# Patient Record
Sex: Female | Born: 1990 | Race: White | Hispanic: No | Marital: Single | Smoking: Current every day smoker
Health system: Southern US, Community
[De-identification: ages and names within clinical notes are randomized; demographics above are authoritative.]

## PROBLEM LIST (undated history)

## (undated) ENCOUNTER — Inpatient Hospital Stay (HOSPITAL_COMMUNITY): Payer: Self-pay

## (undated) DIAGNOSIS — D649 Anemia, unspecified: Secondary | ICD-10-CM

## (undated) DIAGNOSIS — F99 Mental disorder, not otherwise specified: Secondary | ICD-10-CM

## (undated) DIAGNOSIS — F419 Anxiety disorder, unspecified: Secondary | ICD-10-CM

---
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
## 2011-04-29 ENCOUNTER — Inpatient Hospital Stay: Payer: Self-pay | Admitting: Psychiatry

## 2012-04-26 HISTORY — PX: THERAPEUTIC ABORTION: SHX798

## 2012-06-20 ENCOUNTER — Emergency Department: Payer: Self-pay | Admitting: Emergency Medicine

## 2012-06-20 LAB — CBC
HCT: 39 % (ref 35.0–47.0)
HGB: 13.4 g/dL (ref 12.0–16.0)
MCHC: 34.3 g/dL (ref 32.0–36.0)
WBC: 7.2 10*3/uL (ref 3.6–11.0)

## 2012-06-20 LAB — URINALYSIS, COMPLETE
Bacteria: NONE SEEN
Bilirubin,UR: NEGATIVE
Ketone: NEGATIVE
Nitrite: NEGATIVE
RBC,UR: 1 /HPF (ref 0–5)
Squamous Epithelial: 3
WBC UR: 8 /HPF (ref 0–5)

## 2012-06-20 LAB — COMPREHENSIVE METABOLIC PANEL
Alkaline Phosphatase: 47 U/L — ABNORMAL LOW (ref 82–169)
Calcium, Total: 9.4 mg/dL (ref 9.0–10.7)
Chloride: 107 mmol/L (ref 98–107)
Co2: 27 mmol/L (ref 21–32)
EGFR (African American): >60
EGFR (Non-African Amer.): >60
SGPT (ALT): 11 U/L — ABNORMAL LOW

## 2012-06-20 LAB — HCG, QUANTITATIVE, PREGNANCY: Beta Hcg, Quant.: 1398 m[IU]/mL — ABNORMAL HIGH

## 2012-07-27 ENCOUNTER — Emergency Department: Payer: Self-pay | Admitting: Internal Medicine

## 2012-07-27 ENCOUNTER — Emergency Department: Payer: Self-pay | Admitting: Unknown Physician Specialty

## 2012-07-31 ENCOUNTER — Emergency Department: Payer: Self-pay | Admitting: Emergency Medicine

## 2012-11-02 ENCOUNTER — Emergency Department: Payer: Self-pay | Admitting: Emergency Medicine

## 2013-02-12 ENCOUNTER — Emergency Department: Payer: Self-pay | Admitting: Emergency Medicine

## 2013-02-12 LAB — COMPREHENSIVE METABOLIC PANEL
Albumin: 4.3 g/dL (ref 3.4–5.0)
Alkaline Phosphatase: 59 U/L (ref 50–136)
Calcium, Total: 8.4 mg/dL — ABNORMAL LOW (ref 8.5–10.1)
Chloride: 107 mmol/L (ref 98–107)
Co2: 26 mmol/L (ref 21–32)
Glucose: 93 mg/dL (ref 65–99)
SGOT(AST): 25 U/L (ref 15–37)
Sodium: 139 mmol/L (ref 136–145)

## 2013-02-12 LAB — URINALYSIS, COMPLETE
Squamous Epithelial: NONE SEEN
WBC UR: NONE SEEN /HPF (ref 0–5)

## 2013-02-12 LAB — CBC
HGB: 12.1 g/dL (ref 12.0–16.0)
MCH: 32.6 pg (ref 26.0–34.0)
RDW: 12.6 % (ref 11.5–14.5)
WBC: 8 10*3/uL (ref 3.6–11.0)

## 2013-02-12 LAB — PREGNANCY, URINE: Pregnancy Test, Urine: POSITIVE m[IU]/mL

## 2013-02-14 ENCOUNTER — Emergency Department: Payer: Self-pay | Admitting: Unknown Physician Specialty

## 2013-02-14 LAB — COMPREHENSIVE METABOLIC PANEL
Alkaline Phosphatase: 49 U/L — ABNORMAL LOW (ref 50–136)
BUN: 10 mg/dL (ref 7–18)
Bilirubin,Total: 0.3 mg/dL (ref 0.2–1.0)
Calcium, Total: 8.6 mg/dL (ref 8.5–10.1)
Chloride: 110 mmol/L — ABNORMAL HIGH (ref 98–107)
EGFR (African American): >60
EGFR (Non-African Amer.): >60
Glucose: 95 mg/dL (ref 65–99)
SGOT(AST): 27 U/L (ref 15–37)
Total Protein: 7.1 g/dL (ref 6.4–8.2)

## 2013-02-14 LAB — CBC
MCH: 32.5 pg (ref 26.0–34.0)
MCHC: 34.8 g/dL (ref 32.0–36.0)
Platelet: 207 10*3/uL (ref 150–440)
RBC: 3.47 10*6/uL — ABNORMAL LOW (ref 3.80–5.20)
RDW: 12.3 % (ref 11.5–14.5)

## 2013-02-16 ENCOUNTER — Inpatient Hospital Stay (HOSPITAL_COMMUNITY)
Admission: AD | Admit: 2013-02-16 | Discharge: 2013-02-17 | Disposition: A | Discharge status: Home / Self Care | Payer: Self-pay | Source: Ambulatory Visit | Attending: Obstetrics & Gynecology | Admitting: Obstetrics & Gynecology

## 2013-02-16 ENCOUNTER — Inpatient Hospital Stay (HOSPITAL_COMMUNITY): Payer: Self-pay

## 2013-02-16 ENCOUNTER — Encounter (HOSPITAL_COMMUNITY): Payer: Self-pay | Admitting: *Deleted

## 2013-02-16 DIAGNOSIS — O039 Complete or unspecified spontaneous abortion without complication: Secondary | ICD-10-CM

## 2013-02-16 DIAGNOSIS — O200 Threatened abortion: Secondary | ICD-10-CM | POA: Insufficient documentation

## 2013-02-16 HISTORY — DX: Anxiety disorder, unspecified: F41.9

## 2013-02-16 HISTORY — DX: Mental disorder, not otherwise specified: F99

## 2013-02-16 HISTORY — DX: Anemia, unspecified: D64.9

## 2013-02-16 LAB — CBC
HCT: 33.6 % — ABNORMAL LOW (ref 36.0–46.0)
MCV: 91.6 fL (ref 78.0–100.0)
Platelets: 193 10*3/uL (ref 150–400)
RBC: 3.67 MIL/uL — ABNORMAL LOW (ref 3.87–5.11)
RDW: 12.1 % (ref 11.5–15.5)
WBC: 6.1 10*3/uL (ref 4.0–10.5)

## 2013-02-16 LAB — URINALYSIS, ROUTINE W REFLEX MICROSCOPIC
Glucose, UA: NEGATIVE mg/dL
Protein, ur: NEGATIVE mg/dL
Specific Gravity, Urine: 1.01 (ref 1.005–1.030)

## 2013-02-16 LAB — HCG, QUANTITATIVE, PREGNANCY: hCG, Beta Chain, Quant, S: 100 m[IU]/mL — ABNORMAL HIGH (ref <5)

## 2013-02-16 LAB — ABO/RH: ABO/RH(D): A POS

## 2013-02-16 LAB — URINE MICROSCOPIC-ADD ON

## 2013-02-16 MED ORDER — OXYCODONE-ACETAMINOPHEN 5-325 MG PO TABS: 2.0000 | ORAL_TABLET | ORAL | Status: DC | PRN

## 2013-02-16 MED ORDER — CLONAZEPAM 0.5 MG PO TABS
0.5000 mg | ORAL_TABLET | Freq: Once | ORAL | Status: AC
  Administered 2013-02-16: 0.5 mg via ORAL
  Filled 2013-02-16: qty 1

## 2013-02-16 MED ORDER — MISOPROSTOL 200 MCG PO TABS: ORAL_TABLET | ORAL | Status: DC

## 2013-02-16 MED ORDER — KETOROLAC TROMETHAMINE 60 MG/2ML IM SOLN
60.0000 mg | Freq: Once | INTRAMUSCULAR | Status: AC
  Administered 2013-02-16: 60 mg via INTRAMUSCULAR
  Filled 2013-02-16: qty 2

## 2013-02-16 MED ORDER — PROMETHAZINE HCL 25 MG PO TABS: 25.0000 mg | ORAL_TABLET | Freq: Four times a day (QID) | ORAL | Status: DC | PRN

## 2013-02-16 NOTE — Discharge Instructions (Signed)
Miscarriage A miscarriage is the loss of an unborn baby (fetus) before the 20th week of pregnancy. The cause is often unknown.  HOME CARE  You may need to stay in bed (bed rest), or you may be able to do light activity. Go about activity as told by your doctor.  Have help at home.  Write down how many pads you use each day. Write down how soaked they are.  Do not use tampons. Do not wash out your vagina (douche) or have sex (intercourse) until your doctor approves.  Only take medicine as told by your doctor.  Do not take aspirin.  Keep all doctor visits as told.  If you or your partner have problems with grieving, talk to your doctor. You can also try counseling. Give yourself time to grieve before trying to get pregnant again. GET HELP RIGHT AWAY IF:  You have bad cramps or pain in your back or belly (abdomen).  You have a fever.  You pass large clumps of blood (clots) from your vagina that are walnut-sized or larger. Save the clumps for your doctor to see.  You pass large amounts of tissue from your vagina. Save the tissue for your doctor to see.  You have more bleeding.  You have thick, bad-smelling fluid (discharge) coming from the vagina.  You get lightheaded, weak, or you pass out (faint).  You have chills. MAKE SURE YOU:  Understand these instructions.  Will watch your condition.  Will get help right away if you are not doing well or get worse. Document Released: 02/04/2012 Document Reviewed: 02/04/2012 [facility] Patient Information 2013 [place], [place].  Make an appointment to follow-up with your GYN in about 2 weeks Go to an ER if you develop severe pain, heavy bleeding or fever

## 2013-02-16 NOTE — MAU Provider Note (Signed)
History     CSN: [id number]  Arrival date and time: 02/16/13 1744   None     Chief Complaint  Patient presents with  . Vaginal Bleeding  . Abdominal Pain   HPI 22 y.o. G2P0010 at [redacted]w[redacted]d by LMP with bleeding and pain x 2 weeks. Seen at [facility] for this complaint on 3/21 and 3/23. Had pelvic exam with check for infections, quant and u/s on the first visit and repeat quant on the second visit. States she can't remember what the ultrasound showed, but the quant decreased from over 1000 the first day to 250 yesterday. Bleeding has slowed today, but pain continues in low abd, low back, hips and legs. Has not taken anything for pain since this began 2 weeks ago. She states she sees [place], ANP at [facility] in [place] and she told her to come here right away today because she needed a D&C.    Past Medical History  Diagnosis Date  . Anxiety   . PTSD   . Anemia     Past Surgical History  Procedure Laterality Date  . Therapeutic abortion  june 2013  . Wisdom tooth extraction  2012    History reviewed. No pertinent family history.  History  Substance Use Topics  . Smoking status: Current Every Day Smoker -- 0.50 packs/day    Types: Cigarettes  . Smokeless tobacco: Not on file  . Alcohol Use: No    Allergies: No Known Allergies  Prescriptions prior to admission  Medication Sig Dispense Refill  . bacitracin ointment Apply topically 2 (two) times daily. Tooth pain      . Cholecalciferol (D-3-5 PO) Take by mouth.      . clindamycin (CLEOCIN) 150 MG capsule Take 150 mg by mouth 3 (three) times daily.      . clonazePAM (KLONOPIN) 0.5 MG tablet Take 0.5 mg by mouth 2 (two) times daily as needed for anxiety.      . ferrous sulfate 325 (65 FE) MG tablet Take 325 mg by mouth daily with breakfast.        Review of Systems  Constitutional: Negative.  Negative for fever and chills.  Respiratory: Negative.   Cardiovascular: Negative.   Gastrointestinal:  Positive for abdominal pain. Negative for nausea, vomiting, diarrhea and constipation.  Genitourinary: Negative for dysuria, urgency, frequency, hematuria and flank pain.       Positive bleeding   Musculoskeletal: Positive for back pain.  Neurological: Positive for headaches.  Psychiatric/Behavioral: The patient is nervous/anxious.    Physical Exam   Blood pressure 111/55, pulse 97, temperature 98.3 F (36.8 C), temperature source Oral, resp. rate 16, height 5\' 2"  (1.575 m), weight 114 lb 12.8 oz (52.073 kg), last menstrual period 12/04/2012, SpO2 100.00%.  Physical Exam  Nursing note and vitals reviewed. Constitutional: She is oriented to person, place, and time. She appears well-developed and well-nourished. No distress.  Cardiovascular: Normal rate.   Respiratory: Effort normal. No respiratory distress.  GI: Soft. She exhibits no distension and no mass. There is no tenderness. There is no rebound and no guarding.  Musculoskeletal: Normal range of motion.  Neurological: She is alert and oriented to person, place, and time.  Skin: Skin is warm and dry.  Psychiatric: She has a normal mood and affect.    MAU Course  Procedures Results for orders placed during the hospital encounter of 02/16/13 (from the past 24 hour(s))  CBC     Status: Abnormal   Collection Time  02/16/13  5:53 PM      Result Value Range   WBC 6.1  4.0 - 10.5 K/uL   RBC 3.67 (*) 3.87 - 5.11 MIL/uL   Hemoglobin 11.4 (*) 12.0 - 15.0 g/dL   HCT 16.1 (*) 09.6 - 04.5 %   MCV 91.6  78.0 - 100.0 fL   MCH 31.1  26.0 - 34.0 pg   MCHC 33.9  30.0 - 36.0 g/dL   RDW 40.9  81.1 - 91.4 %   Platelets 193  150 - 400 K/uL  HCG, QUANTITATIVE, PREGNANCY     Status: Abnormal   Collection Time    02/16/13  6:00 PM      Result Value Range   hCG, Beta Chain, Quant, S 100 (*) <5 mIU/mL  ABO/RH     Status: None   Collection Time    02/16/13  6:00 PM      Result Value Range   ABO/RH(D) A POS    URINALYSIS, ROUTINE W REFLEX  MICROSCOPIC     Status: Abnormal   Collection Time    02/16/13  6:15 PM      Result Value Range   Color, Urine YELLOW  YELLOW   APPearance CLEAR  CLEAR   Specific Gravity, Urine 1.010  1.005 - 1.030   pH 6.0  5.0 - 8.0   Glucose, UA NEGATIVE  NEGATIVE mg/dL   Hgb urine dipstick SMALL (*) NEGATIVE   Bilirubin Urine NEGATIVE  NEGATIVE   Ketones, ur NEGATIVE  NEGATIVE mg/dL   Protein, ur NEGATIVE  NEGATIVE mg/dL   Urobilinogen, UA 0.2  0.0 - 1.0 mg/dL   Nitrite NEGATIVE  NEGATIVE   Leukocytes, UA SMALL (*) NEGATIVE  URINE MICROSCOPIC-ADD ON     Status: Abnormal   Collection Time    02/16/13  6:15 PM      Result Value Range   Squamous Epithelial / LPF FEW (*) RARE   WBC, UA 3-6  <3 WBC/hpf   Bacteria, UA RARE  RARE  POCT PREGNANCY, URINE     Status: Abnormal   Collection Time    02/16/13  6:19 PM      Result Value Range   Preg Test, Ur POSITIVE (*) NEGATIVE   Pt received toradol, then called out asking for blanket, shortly thereafter called out asking for food, about 30 seconds later Grandmother came out and stated that patient was feeling short of breath/having a panic attack. O2 at 100%, pulse 79, pt alert and aware upon my arrival to room. States she has a history of panic attacks and takes klonopin at home.   Assessment and Plan  22 y.o. G2P0010 at [redacted]w[redacted]d with apparent SAB Awaiting records from [facility] Toradol 60 mg IM for pain Klonopin 0.5 mg PO for panic attack Care assumed by Joseph Berkshire, PA  [facility] 02/16/2013, 7:20 PM   2000 - Care assumed from Georges Mouse, CNM Records received from [facility]  Quant hCG 3/20 - 2983 Quant hCG 3/22 - 234  [place] results from 3/20 No products of conception. Echogenic material noted in the lower uterus and cervix.  *RADIOLOGY REPORT*  Clinical Data: Vaginal bleeding and pain.  OBSTETRIC <14 WK [place] AND TRANSVAGINAL OB US  Technique: Both transabdominal and transvaginal ultrasound  examinations were  performed for complete evaluation of the  gestation as well as the maternal uterus, adnexal regions, and  pelvic cul-de-sac. Transvaginal technique was performed to assess  early pregnancy.  Comparison: None.  A septate or bicornuate  uterus is noted.  Intrauterine gestational sac: None. There is fluid in the right  horn and multiple small cystic areas in the left torn. Possible  retained products.  Yolk sac: None  Embryo: None  Cardiac Activity: None  Heart Rate: N/A bpm  Maternal uterus/adnexae:  Ovaries: Corpus luteum cyst on the right.  Small cysts on the left.  IMPRESSION:  No intrauterine gestational sac. 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  Original Report Authenticated By: Rudie Meyer, M.D.    MDM Discussed patient with Dr. Penne Lash. She suggests patient be given Cytotec at this time. Follow-up with GYN in 2 weeks.   A: SAB  P: Discharge home Rx for Cytotec, percocet and phenergan given to patient with instructions Bleeding precautions discussed Patient encouraged to follow-up with her GYN in [place] in ~ 2 weeks Patient may return to MAU as needed or if her condition were to change or worsen  Freddi Starr, PA-C 02/16/2013 11:58 PM

## 2013-02-16 NOTE — MAU Note (Signed)
Patient states she has been seeing a MD in [place] and twice at [facility] for bleeding and pain. Patient states she has been bleeding for 2 weeks, at times passing multiple large clots and continues to have pain. States that at [facility] her pregnancy level was higher then yesterday was 250.

## 2013-02-19 NOTE — MAU Provider Note (Signed)
Attestation of Attending Supervision of Advanced Practitioner (CNM/NP): Evaluation and management procedures were performed by the Advanced Practitioner under my supervision and collaboration. I have reviewed the Advanced Practitioner's note and chart, and I agree with the management and plan.  Shalane Florendo H. 9:57 AM

## 2013-04-06 ENCOUNTER — Emergency Department (HOSPITAL_COMMUNITY)
Admission: EM | Admit: 2013-04-06 | Discharge: 2013-04-06 | Discharge status: Against Medical Advice | Payer: No Typology Code available for payment source | Attending: Emergency Medicine | Admitting: Emergency Medicine

## 2013-04-06 ENCOUNTER — Encounter (HOSPITAL_COMMUNITY): Payer: Self-pay | Admitting: Emergency Medicine

## 2013-04-06 DIAGNOSIS — F4310 Post-traumatic stress disorder, unspecified: Secondary | ICD-10-CM | POA: Insufficient documentation

## 2013-04-06 DIAGNOSIS — Y92410 Unspecified street and highway as the place of occurrence of the external cause: Secondary | ICD-10-CM | POA: Insufficient documentation

## 2013-04-06 DIAGNOSIS — F411 Generalized anxiety disorder: Secondary | ICD-10-CM | POA: Insufficient documentation

## 2013-04-06 DIAGNOSIS — Y998 Other external cause status: Secondary | ICD-10-CM | POA: Insufficient documentation

## 2013-04-06 DIAGNOSIS — M542 Cervicalgia: Secondary | ICD-10-CM | POA: Insufficient documentation

## 2013-04-06 DIAGNOSIS — D649 Anemia, unspecified: Secondary | ICD-10-CM | POA: Insufficient documentation
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
## 2013-04-06 NOTE — ED Notes (Signed)
Called x 3 for patient.  Will call patient again when another room opens.

## 2013-04-06 NOTE — ED Notes (Signed)
Pt did not answer when called

## 2013-04-07 ENCOUNTER — Emergency Department: Payer: Self-pay | Admitting: Emergency Medicine

## 2013-09-30 ENCOUNTER — Emergency Department: Payer: Self-pay | Admitting: Emergency Medicine

## 2013-09-30 LAB — URINALYSIS, COMPLETE
Bilirubin,UR: NEGATIVE
Bilirubin,UR: NEGATIVE
Glucose,UR: NEGATIVE mg/dL (ref 0–75)
Glucose,UR: NEGATIVE mg/dL (ref 0–75)
Leukocyte Esterase: NEGATIVE
Nitrite: NEGATIVE
Nitrite: NEGATIVE
Ph: 7 (ref 4.5–8.0)
Ph: 8 (ref 4.5–8.0)
Protein: NEGATIVE
Protein: NEGATIVE
RBC,UR: 22 /HPF (ref 0–5)
Specific Gravity: 1.018 (ref 1.003–1.030)
Specific Gravity: 1.015 (ref 1.003–1.030)
Squamous Epithelial: 9

## 2013-09-30 LAB — CBC
HCT: 40.5 % (ref 35.0–47.0)
HGB: 14 g/dL (ref 12.0–16.0)
MCHC: 34.6 g/dL (ref 32.0–36.0)
MCV: 94 fL (ref 80–100)
Platelet: 222 10*3/uL (ref 150–440)
RBC: 4.32 10*6/uL (ref 3.80–5.20)

## 2013-09-30 LAB — COMPREHENSIVE METABOLIC PANEL
Alkaline Phosphatase: 60 U/L (ref 50–136)
Anion Gap: 1 — ABNORMAL LOW (ref 7–16)
BUN: 6 mg/dL — ABNORMAL LOW (ref 7–18)
Bilirubin,Total: 0.5 mg/dL (ref 0.2–1.0)
EGFR (African American): >60
EGFR (Non-African Amer.): >60
Glucose: 100 mg/dL — ABNORMAL HIGH (ref 65–99)
Osmolality: 272 (ref 275–301)
Potassium: 4.1 mmol/L (ref 3.5–5.1)

## 2013-09-30 LAB — LIPASE, BLOOD: Lipase: 149 U/L (ref 73–393)

## 2013-12-22 ENCOUNTER — Encounter (HOSPITAL_COMMUNITY): Payer: Self-pay | Admitting: *Deleted

## 2014-03-06 ENCOUNTER — Emergency Department: Payer: Self-pay | Admitting: Emergency Medicine

## 2014-09-27 ENCOUNTER — Encounter (HOSPITAL_COMMUNITY): Payer: Self-pay | Admitting: *Deleted

## 2015-04-04 ENCOUNTER — Emergency Department: Payer: Self-pay

## 2015-04-04 ENCOUNTER — Encounter: Payer: Self-pay | Admitting: Emergency Medicine

## 2015-04-04 ENCOUNTER — Emergency Department
Admission: EM | Admit: 2015-04-04 | Discharge: 2015-04-04 | Disposition: A | Discharge status: Home / Self Care | Payer: Self-pay | Attending: Emergency Medicine | Admitting: Emergency Medicine

## 2015-04-04 DIAGNOSIS — W1839XA Other fall on same level, initial encounter: Secondary | ICD-10-CM | POA: Insufficient documentation

## 2015-04-04 DIAGNOSIS — Z79899 Other long term (current) drug therapy: Secondary | ICD-10-CM | POA: Insufficient documentation

## 2015-04-04 DIAGNOSIS — Z720 Tobacco use: Secondary | ICD-10-CM | POA: Insufficient documentation

## 2015-04-04 DIAGNOSIS — Y9289 Other specified places as the place of occurrence of the external cause: Secondary | ICD-10-CM | POA: Insufficient documentation

## 2015-04-04 DIAGNOSIS — Y9389 Activity, other specified: Secondary | ICD-10-CM | POA: Insufficient documentation

## 2015-04-04 DIAGNOSIS — S60222A Contusion of left hand, initial encounter: Secondary | ICD-10-CM | POA: Insufficient documentation

## 2015-04-04 DIAGNOSIS — Y998 Other external cause status: Secondary | ICD-10-CM | POA: Insufficient documentation

## 2015-04-04 MED ORDER — TRAMADOL HCL 50 MG PO TABS
50.0000 mg | ORAL_TABLET | Freq: Once | ORAL | Status: AC
  Administered 2015-04-04: 50 mg via ORAL

## 2015-04-04 MED ORDER — KETOROLAC TROMETHAMINE 10 MG PO TABS: 10.0000 mg | ORAL_TABLET | Freq: Four times a day (QID) | ORAL | Status: DC | PRN

## 2015-04-04 MED ORDER — TRAMADOL HCL 50 MG PO TABS
ORAL_TABLET | ORAL | Status: AC
  Administered 2015-04-04: 50 mg via ORAL
  Filled 2015-04-04: qty 1

## 2015-04-04 MED ORDER — KETOROLAC TROMETHAMINE 10 MG PO TABS
ORAL_TABLET | ORAL | Status: AC
  Administered 2015-04-04: 10 mg via ORAL
  Filled 2015-04-04: qty 1

## 2015-04-04 MED ORDER — KETOROLAC TROMETHAMINE 10 MG PO TABS
10.0000 mg | ORAL_TABLET | Freq: Once | ORAL | Status: AC
  Administered 2015-04-04: 10 mg via ORAL

## 2015-04-04 MED ORDER — TRAMADOL HCL 50 MG PO TABS: 50.0000 mg | ORAL_TABLET | Freq: Four times a day (QID) | ORAL | Status: DC | PRN

## 2015-04-04 NOTE — ED Notes (Signed)
Hand swollen

## 2015-04-04 NOTE — ED Provider Notes (Signed)
[facility] Emergency Department Provider Note  ____________________________________________  Time seen: Approximately 1647  I have reviewed the triage vital signs and the nursing notes.   HISTORY  Chief Complaint Hand Injury    HPI Marissa Calderon is a 24 y.o. female fell down landing on her left handnow having swelling and bruising to her and is concerned that she has a fracture rates her painas a 10 out of 10 with any type of movement or touch to her hands and somewhat with ice and rest denies any numbness tingling or weakness in the hand says she has trouble closing her hand due to the swelling no other associated signs symptoms of note   Past Medical History  Diagnosis Date  . Anxiety   . PTSD   . Anemia     There are no active problems to display for this patient.   Past Surgical History  Procedure Laterality Date  . Therapeutic abortion  june 2013  . Wisdom tooth extraction  2012    Current Outpatient Rx  Name  Route  Sig  Dispense  Refill  . bacitracin ointment   Topical   Apply topically 2 (two) times daily. Tooth pain         . Cholecalciferol (D-3-5 PO)   Oral   Take by mouth.         . clindamycin (CLEOCIN) 150 MG capsule   Oral   Take 150 mg by mouth 3 (three) times daily.         . clonazePAM (KLONOPIN) 0.5 MG tablet   Oral   Take 0.5 mg by mouth 2 (two) times daily as needed for anxiety.         . ferrous sulfate 325 (65 FE) MG tablet   Oral   Take 325 mg by mouth daily with breakfast.         . ketorolac (TORADOL) 10 MG tablet   Oral   Take 1 tablet (10 mg total) by mouth every 6 (six) hours as needed.   20 tablet   0   . misoprostol (CYTOTEC) 200 MCG tablet      Insert 4 tabs (800 mcg) vaginally at the same time once   4 tablet   0   . oxyCODONE-acetaminophen (PERCOCET/ROXICET) 5-325 MG per tablet   Oral   Take 2 tablets by mouth every 4 (four) hours as needed for pain.   15 tablet   0    . promethazine (PHENERGAN) 25 MG tablet   Oral   Take 1 tablet (25 mg total) by mouth every 6 (six) hours as needed for nausea.   30 tablet   0   . traMADol (ULTRAM) 50 MG tablet   Oral   Take 1 tablet (50 mg total) by mouth every 6 (six) hours as needed for moderate pain.   10 tablet   0     Allergies Review of patient's allergies indicates no known allergies.  No family history on file.  Social History History  Substance Use Topics  . Smoking status: Current Every Day Smoker -- 0.10 packs/day    Types: Cigarettes  . Smokeless tobacco: Not on file  . Alcohol Use: Not on file    Review of Systems Constitutional: No fever/chills Eyes: No visual changes. ENT: No sore throat. Cardiovascular: Denies chest pain. Respiratory: Denies shortness of breath. Gastrointestinal: No abdominal pain.  No nausea, no vomiting.  No diarrhea.  No constipation. Genitourinary: Negative for dysuria. Musculoskeletal: Negative  for back pain. Skin: Negative for rash. Neurological: Negative for headaches, focal weakness or numbness.  6-point ROS otherwise negative.  ____________________________________________   PHYSICAL EXAM:  VITAL SIGNS: ED Triage Vitals  Enc Vitals Group     BP 04/04/15 1610 110/63 mmHg     Pulse Rate 04/04/15 1610 83     Resp --      Temp 04/04/15 1610 97.9 F (36.6 C)     Temp Source 04/04/15 1610 Oral     SpO2 04/04/15 1610 98 %     Weight 04/04/15 1610 122 lb (55.339 kg)     Height 04/04/15 1610 5\' 5"  (1.651 m)     Head Cir --      Peak Flow --      Pain Score 04/04/15 1610 10     Pain Loc --      Pain Edu? --      Excl. in GC? --     Constitutional: Alert and oriented. Well appearing and in no acute distress. Eyes: Conjunctivae are normal. PERRL. EOMI. Head: Atraumatic. Nose: No congestion/rhinnorhea. Mouth/Throat: Mucous membranes are moist.  Oropharynx non-erythematous. Neck: No stridor.   Cardiovascular: Normal rate, regular rhythm. Grossly  normal heart sounds.  Good peripheral circulation. It Refill good pulses in the extremities Respiratory: Normal respiratory effort.  No retractions. Lungs CTAB. Musculoskeletal: No lower extremity tenderness nor edema.  Patient does have swelling to her left hand across the third fourth and fifth metacarpals without palpable deformity or abnormality noted ability to flex the fingers due to pain and swelling actively passively full range of motion Neurologic:  Normal speech and language. No gross focal neurologic deficits are appreciated. Speech is normal. No gait instability. Skin:  Skin is warm, dry and intact. No rash noted. Bruising over the left hand Psychiatric: Mood and affect are normal. Speech and behavior are normal.  ____________________________________________    RADIOLOGY  X-rays on this patient were negative for her left hand ____________________________________________   PROCEDURES  Procedure(s) performed: None  Critical Care performed: No  ____________________________________________   INITIAL IMPRESSION / ASSESSMENT AND PLAN / ED COURSE  Pertinent labs & imaging results that were available during my care of the patient were reviewed by me and considered in my medical decision making (see chart for details).  Initial impression left hand contusion patient be discharged Ace wrap was applied in the department as well as a sling and ice pack is home keep hand elevated take Motrin as needed for pain was also prescribed tramadol ORTHOPEDICS as needed if symptoms persist return here for any acute concerns or worsening symptoms ____________________________________________   FINAL CLINICAL IMPRESSION(S) / ED DIAGNOSES  Final diagnoses:  Hand contusion, left, initial encounter     Benigno Check Rosalyn GessWilliam C Theran Vandergrift, PA-C 04/04/15 1736  Sharyn CreamerMark Quale, MD 04/09/15 1705

## 2015-04-04 NOTE — Discharge Instructions (Signed)
Contusion °A contusion is a deep bruise. Contusions happen when an injury causes bleeding under the skin. Signs of bruising include pain, puffiness (swelling), and discolored skin. The contusion may turn blue, purple, or yellow. °HOME CARE  °· Put ice on the injured area. °¨ Put ice in a plastic bag. °¨ Place a towel between your skin and the bag. °¨ Leave the ice on for 15-20 minutes, 03-04 times a day. °· Only take medicine as told by your doctor. °· Rest the injured area. °· If possible, raise (elevate) the injured area to lessen puffiness. °GET HELP RIGHT AWAY IF:  °· You have more bruising or puffiness. °· You have pain that is getting worse. °· Your puffiness or pain is not helped by medicine. °MAKE SURE YOU:  °· Understand these instructions. °· Will watch your condition. °· Will get help right away if you are not doing well or get worse. °Document Released: 04/30/2008 Document Revised: 02/04/2012 Document Reviewed: 09/17/2011 °ExitCare® Patient Information ©2015 [employer]. This information is not intended to replace advice given to you by your health care provider. Make sure you discuss any questions you have with your health care provider. ° °Blunt Trauma °You have been evaluated for injuries. You have been examined and your caregiver has not found injuries serious enough to require hospitalization. °It is common to have multiple bruises and sore muscles following an accident. These tend to feel worse for the first 24 hours. You will feel more stiffness and soreness over the next several hours and worse when you wake up the first morning after your accident. After this point, you should begin to improve with each passing day. The amount of improvement depends on the amount of damage done in the accident. °Following your accident, if some part of your body does not work as it should, or if the pain in any area continues to increase, you should return to the Emergency Department for re-evaluation.  °HOME  CARE INSTRUCTIONS  °Routine care for sore areas should include: °· Ice to sore areas every 2 hours for 20 minutes while awake for the next 2 days. °· Drink extra fluids (not alcohol). °· Take a hot or warm shower or bath once or twice a day to increase blood flow to sore muscles. This will help you "limber up". °· Activity as tolerated. Lifting may aggravate neck or back pain. °· Only take over-the-counter or prescription medicines for pain, discomfort, or fever as directed by your caregiver. Do not use aspirin. This may increase bruising or increase bleeding if there are small areas where this is happening. °SEEK IMMEDIATE MEDICAL CARE IF: °· Numbness, tingling, weakness, or problem with the use of your arms or legs. °· A severe headache is not relieved with medications. °· There is a change in bowel or bladder control. °· Increasing pain in any areas of the body. °· Short of breath or dizzy. °· Nauseated, vomiting, or sweating. °· Increasing belly (abdominal) discomfort. °· Blood in urine, stool, or vomiting blood. °· Pain in either shoulder in an area where a shoulder strap would be. °· Feelings of lightheadedness or if you have a fainting episode. °Sometimes it is not possible to identify all injuries immediately after the trauma. It is important that you continue to monitor your condition after the emergency department visit. If you feel you are not improving, or improving more slowly than should be expected, call your physician. If you feel your symptoms (problems) are worsening, return to the Emergency   Department immediately. °Document Released: 08/08/2001 Document Revised: 02/04/2012 Document Reviewed: 06/30/2008 °ExitCare® Patient Information ©2015 [employer]. This information is not intended to replace advice given to you by your health care provider. Make sure you discuss any questions you have with your health care provider. ° °Cryotherapy °Cryotherapy means treatment with cold. Ice or gel packs can  be used to reduce both pain and swelling. Ice is the most helpful within the first 24 to 48 hours after an injury or flare-up from overusing a muscle or joint. Sprains, strains, spasms, burning pain, shooting pain, and aches can all be eased with ice. Ice can also be used when recovering from surgery. Ice is effective, has very few side effects, and is safe for most people to use. °PRECAUTIONS  °Ice is not a safe treatment option for people with: °· Raynaud phenomenon. This is a condition affecting small blood vessels in the extremities. Exposure to cold may cause your problems to return. °· Cold hypersensitivity. There are many forms of cold hypersensitivity, including: °¨ Cold urticaria. Red, itchy hives appear on the skin when the tissues begin to warm after being iced. °¨ Cold erythema. This is a red, itchy rash caused by exposure to cold. °¨ Cold hemoglobinuria. Red blood cells break down when the tissues begin to warm after being iced. The hemoglobin that carry oxygen are passed into the urine because they cannot combine with blood proteins fast enough. °· Numbness or altered sensitivity in the area being iced. °If you have any of the following conditions, do not use ice until you have discussed cryotherapy with your caregiver: °· Heart conditions, such as arrhythmia, angina, or chronic heart disease. °· High blood pressure. °· Healing wounds or open skin in the area being iced. °· Current infections. °· Rheumatoid arthritis. °· Poor circulation. °· Diabetes. °Ice slows the blood flow in the region it is applied. This is beneficial when trying to stop inflamed tissues from spreading irritating chemicals to surrounding tissues. However, if you expose your skin to cold temperatures for too long or without the proper protection, you can damage your skin or nerves. Watch for signs of skin damage due to cold. °HOME CARE INSTRUCTIONS °Follow these tips to use ice and cold packs safely. °· Place a dry or damp towel  between the ice and skin. A damp towel will cool the skin more quickly, so you may need to shorten the time that the ice is used. °· For a more rapid response, add gentle compression to the ice. °· Ice for no more than 10 to 20 minutes at a time. The bonier the area you are icing, the less time it will take to get the benefits of ice. °· Check your skin after 5 minutes to make sure there are no signs of a poor response to cold or skin damage. °· Rest 20 minutes or more between uses. °· Once your skin is numb, you can end your treatment. You can test numbness by very lightly touching your skin. The touch should be so light that you do not see the skin dimple from the pressure of your fingertip. When using ice, most people will feel these normal sensations in this order: cold, burning, aching, and numbness. °· Do not use ice on someone who cannot communicate their responses to pain, such as small children or people with dementia. °HOW TO MAKE AN ICE PACK °Ice packs are the most common way to use ice therapy. Other methods include ice massage, ice baths,   and cryosprays. Muscle creams that cause a cold, tingly feeling do not offer the same benefits that ice offers and should not be used as a substitute unless recommended by your caregiver. °To make an ice pack, do one of the following: °· Place crushed ice or a bag of frozen vegetables in a sealable plastic bag. Squeeze out the excess air. Place this bag inside another plastic bag. Slide the bag into a pillowcase or place a damp towel between your skin and the bag. °· Mix 3 parts water with 1 part rubbing alcohol. Freeze the mixture in a sealable plastic bag. When you remove the mixture from the freezer, it will be slushy. Squeeze out the excess air. Place this bag inside another plastic bag. Slide the bag into a pillowcase or place a damp towel between your skin and the bag. °SEEK MEDICAL CARE IF: °· You develop white spots on your skin. This may give the skin a  blotchy (mottled) appearance. °· Your skin turns blue or pale. °· Your skin becomes waxy or hard. °· Your swelling gets worse. °MAKE SURE YOU:  °· Understand these instructions. °· Will watch your condition. °· Will get help right away if you are not doing well or get worse. °Document Released: 07/09/2011 Document Revised: 03/29/2014 Document Reviewed: 07/09/2011 °ExitCare® Patient Information ©2015 [employer]. This information is not intended to replace advice given to you by your health care provider. Make sure you discuss any questions you have with your health care provider. ° °

## 2015-04-08 ENCOUNTER — Emergency Department (HOSPITAL_COMMUNITY): Payer: Self-pay | Admitting: Anesthesiology

## 2015-04-08 ENCOUNTER — Emergency Department (INDEPENDENT_AMBULATORY_CARE_PROVIDER_SITE_OTHER)
Admission: EM | Admit: 2015-04-08 | Discharge: 2015-04-08 | Disposition: A | Discharge status: Other Acute Inpatient Hospital | Payer: Self-pay | Source: Home / Self Care | Attending: Family Medicine | Admitting: Family Medicine

## 2015-04-08 ENCOUNTER — Encounter (HOSPITAL_COMMUNITY)
Admission: EM | Disposition: A | Discharge status: Home / Self Care | Payer: Self-pay | Source: Home / Self Care | Attending: Emergency Medicine

## 2015-04-08 ENCOUNTER — Emergency Department (HOSPITAL_COMMUNITY): Payer: Self-pay | Attending: Family Medicine

## 2015-04-08 ENCOUNTER — Encounter (HOSPITAL_COMMUNITY): Payer: Self-pay

## 2015-04-08 ENCOUNTER — Encounter (HOSPITAL_COMMUNITY): Payer: Self-pay | Admitting: Emergency Medicine

## 2015-04-08 ENCOUNTER — Ambulatory Visit (HOSPITAL_COMMUNITY)
Admission: EM | Admit: 2015-04-08 | Discharge: 2015-04-09 | Disposition: A | Discharge status: Home / Self Care | Payer: Self-pay | Attending: Emergency Medicine | Admitting: Emergency Medicine

## 2015-04-08 DIAGNOSIS — L02512 Cutaneous abscess of left hand: Secondary | ICD-10-CM

## 2015-04-08 DIAGNOSIS — D649 Anemia, unspecified: Secondary | ICD-10-CM | POA: Insufficient documentation

## 2015-04-08 DIAGNOSIS — F17210 Nicotine dependence, cigarettes, uncomplicated: Secondary | ICD-10-CM | POA: Insufficient documentation

## 2015-04-08 DIAGNOSIS — F419 Anxiety disorder, unspecified: Secondary | ICD-10-CM | POA: Insufficient documentation

## 2015-04-08 DIAGNOSIS — F4310 Post-traumatic stress disorder, unspecified: Secondary | ICD-10-CM | POA: Insufficient documentation

## 2015-04-08 HISTORY — PX: I & D EXTREMITY: SHX5045

## 2015-04-08 LAB — CBC WITH DIFFERENTIAL/PLATELET
BASOS PCT: 0 % (ref 0–1)
Basophils Absolute: 0 10*3/uL (ref 0.0–0.1)
EOS PCT: 0 % (ref 0–5)
Eosinophils Absolute: 0 10*3/uL (ref 0.0–0.7)
HEMATOCRIT: 40.8 % (ref 36.0–46.0)
Hemoglobin: 13.6 g/dL (ref 12.0–15.0)
LYMPHS PCT: 23 % (ref 12–46)
Lymphs Abs: 2.3 10*3/uL (ref 0.7–4.0)
MCH: 30.4 pg (ref 26.0–34.0)
MCHC: 33.3 g/dL (ref 30.0–36.0)
MCV: 91.3 fL (ref 78.0–100.0)
MONO ABS: 0.6 10*3/uL (ref 0.1–1.0)
Monocytes Relative: 6 % (ref 3–12)
NEUTROS PCT: 71 % (ref 43–77)
Neutro Abs: 7 10*3/uL (ref 1.7–7.7)
Platelets: 251 10*3/uL (ref 150–400)
RBC: 4.47 MIL/uL (ref 3.87–5.11)
RDW: 12.3 % (ref 11.5–15.5)
WBC: 9.9 10*3/uL (ref 4.0–10.5)

## 2015-04-08 LAB — BASIC METABOLIC PANEL
Anion gap: 11 (ref 5–15)
BUN: 11 mg/dL (ref 6–20)
CALCIUM: 9.3 mg/dL (ref 8.9–10.3)
CO2: 23 mmol/L (ref 22–32)
CREATININE: 0.67 mg/dL (ref 0.44–1.00)
Chloride: 106 mmol/L (ref 101–111)
GFR calc Af Amer: >60 mL/min (ref >60)
GLUCOSE: 92 mg/dL (ref 65–99)
Potassium: 4.2 mmol/L (ref 3.5–5.1)
SODIUM: 140 mmol/L (ref 135–145)

## 2015-04-08 LAB — POC URINE PREG, ED: Preg Test, Ur: NEGATIVE

## 2015-04-08 SURGERY — IRRIGATION AND DEBRIDEMENT EXTREMITY
Anesthesia: General | Site: Hand | Laterality: Left

## 2015-04-08 MED ORDER — HYDROMORPHONE HCL 1 MG/ML IJ SOLN
INTRAMUSCULAR | Status: DC
Start: 2015-04-08 — End: 2015-04-09
  Filled 2015-04-08: qty 1

## 2015-04-08 MED ORDER — BUPIVACAINE HCL (PF) 0.25 % IJ SOLN
INTRAMUSCULAR | Status: AC
Start: 2015-04-08 — End: 2015-04-08
  Filled 2015-04-08: qty 30

## 2015-04-08 MED ORDER — BUPIVACAINE HCL (PF) 0.25 % IJ SOLN
INTRAMUSCULAR | Status: DC | PRN
Start: 2015-04-08 — End: 2015-04-08
  Administered 2015-04-08: 10 mL

## 2015-04-08 MED ORDER — MEPERIDINE HCL 25 MG/ML IJ SOLN: 6.2500 mg | INTRAMUSCULAR | Status: DC | PRN

## 2015-04-08 MED ORDER — FENTANYL CITRATE (PF) 250 MCG/5ML IJ SOLN
INTRAMUSCULAR | Status: AC
  Filled 2015-04-08: qty 5

## 2015-04-08 MED ORDER — MORPHINE SULFATE 4 MG/ML IJ SOLN
4.0000 mg | Freq: Once | INTRAMUSCULAR | Status: AC
  Administered 2015-04-08: 4 mg via INTRAVENOUS
  Filled 2015-04-08: qty 1

## 2015-04-08 MED ORDER — VANCOMYCIN HCL 1000 MG IV SOLR
1000.0000 mg | INTRAVENOUS | Status: DC | PRN
  Administered 2015-04-08: 1000 mg via INTRAVENOUS

## 2015-04-08 MED ORDER — HYDROMORPHONE HCL 1 MG/ML IJ SOLN
INTRAMUSCULAR | Status: AC
  Filled 2015-04-08: qty 1

## 2015-04-08 MED ORDER — OXYCODONE-ACETAMINOPHEN 5-325 MG PO TABS: ORAL_TABLET | ORAL | Status: DC

## 2015-04-08 MED ORDER — SODIUM CHLORIDE 0.9 % IV SOLN
INTRAVENOUS | Status: DC
  Administered 2015-04-08: 21:00:00 via INTRAVENOUS

## 2015-04-08 MED ORDER — PROPOFOL 10 MG/ML IV BOLUS
INTRAVENOUS | Status: AC
  Filled 2015-04-08: qty 20

## 2015-04-08 MED ORDER — MIDAZOLAM HCL 5 MG/5ML IJ SOLN
INTRAMUSCULAR | Status: DC | PRN
  Administered 2015-04-08 (×2): 2 mg via INTRAVENOUS

## 2015-04-08 MED ORDER — PROPOFOL 10 MG/ML IV BOLUS
INTRAVENOUS | Status: DC | PRN
  Administered 2015-04-08: 150 mg via INTRAVENOUS

## 2015-04-08 MED ORDER — SUCCINYLCHOLINE CHLORIDE 20 MG/ML IJ SOLN
INTRAMUSCULAR | Status: DC | PRN
  Administered 2015-04-08: 100 mg via INTRAVENOUS

## 2015-04-08 MED ORDER — MIDAZOLAM HCL 2 MG/2ML IJ SOLN
INTRAMUSCULAR | Status: AC
  Filled 2015-04-08: qty 2

## 2015-04-08 MED ORDER — VANCOMYCIN HCL IN DEXTROSE 1-5 GM/200ML-% IV SOLN
INTRAVENOUS | Status: AC
  Filled 2015-04-08: qty 200

## 2015-04-08 MED ORDER — ONDANSETRON HCL 4 MG/2ML IJ SOLN
4.0000 mg | Freq: Once | INTRAMUSCULAR | Status: AC
  Administered 2015-04-08: 4 mg via INTRAVENOUS
  Filled 2015-04-08: qty 2

## 2015-04-08 MED ORDER — OXYCODONE HCL 5 MG PO TABS
ORAL_TABLET | ORAL | Status: DC
Start: 2015-04-08 — End: 2015-04-09
  Filled 2015-04-08: qty 1

## 2015-04-08 MED ORDER — ONDANSETRON HCL 4 MG/2ML IJ SOLN
INTRAMUSCULAR | Status: DC | PRN
  Administered 2015-04-08: 4 mg via INTRAVENOUS

## 2015-04-08 MED ORDER — SODIUM CHLORIDE 0.9 % IR SOLN
Status: DC | PRN
  Administered 2015-04-08: 1000 mL

## 2015-04-08 MED ORDER — LIDOCAINE HCL (CARDIAC) 20 MG/ML IV SOLN
INTRAVENOUS | Status: AC
  Filled 2015-04-08: qty 15

## 2015-04-08 MED ORDER — HYDROMORPHONE HCL 1 MG/ML IJ SOLN
0.2500 mg | INTRAMUSCULAR | Status: DC | PRN
  Administered 2015-04-08 (×4): 0.5 mg via INTRAVENOUS

## 2015-04-08 MED ORDER — OXYCODONE HCL 5 MG PO TABS
5.0000 mg | ORAL_TABLET | Freq: Once | ORAL | Status: AC | PRN
  Administered 2015-04-08: 5 mg via ORAL

## 2015-04-08 MED ORDER — ONDANSETRON HCL 4 MG/2ML IJ SOLN
INTRAMUSCULAR | Status: AC
  Filled 2015-04-08: qty 2

## 2015-04-08 MED ORDER — PROMETHAZINE HCL 25 MG/ML IJ SOLN: 6.2500 mg | INTRAMUSCULAR | Status: DC | PRN

## 2015-04-08 MED ORDER — FENTANYL CITRATE (PF) 100 MCG/2ML IJ SOLN
INTRAMUSCULAR | Status: DC | PRN
  Administered 2015-04-08: 50 ug via INTRAVENOUS
  Administered 2015-04-08 (×2): 100 ug via INTRAVENOUS

## 2015-04-08 MED ORDER — KETOROLAC TROMETHAMINE 30 MG/ML IJ SOLN
INTRAMUSCULAR | Status: DC
Start: 2015-04-08 — End: 2015-04-09
  Filled 2015-04-08: qty 1

## 2015-04-08 MED ORDER — KETOROLAC TROMETHAMINE 30 MG/ML IJ SOLN
30.0000 mg | Freq: Once | INTRAMUSCULAR | Status: AC | PRN
  Administered 2015-04-08: 30 mg via INTRAVENOUS

## 2015-04-08 MED ORDER — LIDOCAINE HCL (CARDIAC) 20 MG/ML IV SOLN
INTRAVENOUS | Status: DC | PRN
  Administered 2015-04-08: 50 mg via INTRAVENOUS

## 2015-04-08 MED ORDER — SULFAMETHOXAZOLE-TRIMETHOPRIM 800-160 MG PO TABS: 1.0000 | ORAL_TABLET | Freq: Two times a day (BID) | ORAL | Status: DC

## 2015-04-08 MED ORDER — HYDROMORPHONE HCL 1 MG/ML IJ SOLN
INTRAMUSCULAR | Status: DC | PRN
  Administered 2015-04-08: 1 mg via INTRAVENOUS

## 2015-04-08 MED ORDER — SUCCINYLCHOLINE CHLORIDE 20 MG/ML IJ SOLN
INTRAMUSCULAR | Status: AC
  Filled 2015-04-08: qty 1

## 2015-04-08 MED ORDER — OXYCODONE HCL 5 MG/5ML PO SOLN: 5.0000 mg | Freq: Once | ORAL | Status: AC | PRN

## 2015-04-08 SURGICAL SUPPLY — 53 items
BANDAGE COBAN STERILE 2 (GAUZE/BANDAGES/DRESSINGS) IMPLANT
BANDAGE ELASTIC 3 VELCRO ST LF (GAUZE/BANDAGES/DRESSINGS) ×2 IMPLANT
BANDAGE ELASTIC 4 VELCRO ST LF (GAUZE/BANDAGES/DRESSINGS) ×2 IMPLANT
BNDG COHESIVE 1X5 TAN STRL LF (GAUZE/BANDAGES/DRESSINGS) IMPLANT
BNDG CONFORM 2 STRL LF (GAUZE/BANDAGES/DRESSINGS) IMPLANT
BNDG ESMARK 4X9 LF (GAUZE/BANDAGES/DRESSINGS) ×2 IMPLANT
BNDG GAUZE ELAST 4 BULKY (GAUZE/BANDAGES/DRESSINGS) ×2 IMPLANT
CORDS BIPOLAR (ELECTRODE) ×2 IMPLANT
COVER SURGICAL LIGHT HANDLE (MISCELLANEOUS) ×2 IMPLANT
DECANTER SPIKE VIAL GLASS SM (MISCELLANEOUS) IMPLANT
DRAIN PENROSE 1/4X12 LTX STRL (WOUND CARE) IMPLANT
DRSG ADAPTIC 3X8 NADH LF (GAUZE/BANDAGES/DRESSINGS) IMPLANT
DRSG EMULSION OIL 3X3 NADH (GAUZE/BANDAGES/DRESSINGS) IMPLANT
DRSG PAD ABDOMINAL 8X10 ST (GAUZE/BANDAGES/DRESSINGS) IMPLANT
GAUZE IODOFORM PACK 1/2 7832 (GAUZE/BANDAGES/DRESSINGS) ×2 IMPLANT
GAUZE SPONGE 4X4 12PLY STRL (GAUZE/BANDAGES/DRESSINGS) ×2 IMPLANT
GAUZE XEROFORM 1X8 LF (GAUZE/BANDAGES/DRESSINGS) IMPLANT
GLOVE BIO SURGEON STRL SZ7.5 (GLOVE) ×2 IMPLANT
GLOVE BIOGEL PI IND STRL 8 (GLOVE) ×1 IMPLANT
GLOVE BIOGEL PI INDICATOR 8 (GLOVE) ×1
GOWN STRL REUS W/ TWL LRG LVL3 (GOWN DISPOSABLE) ×1 IMPLANT
GOWN STRL REUS W/TWL LRG LVL3 (GOWN DISPOSABLE) ×1
KIT BASIN OR (CUSTOM PROCEDURE TRAY) ×2 IMPLANT
KIT ROOM TURNOVER OR (KITS) ×2 IMPLANT
LOOP VESSEL MAXI BLUE (MISCELLANEOUS) IMPLANT
LOOP VESSEL MINI RED (MISCELLANEOUS) IMPLANT
MANIFOLD NEPTUNE II (INSTRUMENTS) ×2 IMPLANT
NEEDLE HYPO 25X1 1.5 SAFETY (NEEDLE) ×2 IMPLANT
NS IRRIG 1000ML POUR BTL (IV SOLUTION) ×2 IMPLANT
PACK ORTHO EXTREMITY (CUSTOM PROCEDURE TRAY) ×2 IMPLANT
PAD ARMBOARD 7.5X6 YLW CONV (MISCELLANEOUS) ×4 IMPLANT
PAD CAST 4YDX4 CTTN HI CHSV (CAST SUPPLIES) ×1 IMPLANT
PADDING CAST COTTON 4X4 STRL (CAST SUPPLIES) ×1
SCRUB BETADINE 4OZ XXX (MISCELLANEOUS) ×2 IMPLANT
SET CYSTO W/LG BORE CLAMP LF (SET/KITS/TRAYS/PACK) ×2 IMPLANT
SOLUTION BETADINE 4OZ (MISCELLANEOUS) ×2 IMPLANT
SPLINT PLASTER EXTRA FAST 3X15 (CAST SUPPLIES) ×1
SPLINT PLASTER GYPS XFAST 3X15 (CAST SUPPLIES) ×1 IMPLANT
SPONGE GAUZE 4X4 12PLY STER LF (GAUZE/BANDAGES/DRESSINGS) ×2 IMPLANT
SPONGE LAP 18X18 X RAY DECT (DISPOSABLE) ×2 IMPLANT
SPONGE LAP 4X18 X RAY DECT (DISPOSABLE) ×2 IMPLANT
SUCTION FRAZIER TIP 10 FR DISP (SUCTIONS) ×2 IMPLANT
SUT ETHILON 4 0 PS 2 18 (SUTURE) ×2 IMPLANT
SUT MON AB 5-0 P3 18 (SUTURE) IMPLANT
SYR CONTROL 10ML LL (SYRINGE) IMPLANT
TOWEL OR 17X24 6PK STRL BLUE (TOWEL DISPOSABLE) ×2 IMPLANT
TOWEL OR 17X26 10 PK STRL BLUE (TOWEL DISPOSABLE) ×2 IMPLANT
TUBE ANAEROBIC SPECIMEN COL (MISCELLANEOUS) IMPLANT
TUBE CONNECTING 12X1/4 (SUCTIONS) ×2 IMPLANT
TUBE FEEDING 5FR 15 INCH (TUBING) IMPLANT
UNDERPAD 30X30 INCONTINENT (UNDERPADS AND DIAPERS) ×2 IMPLANT
WATER STERILE IRR 1000ML POUR (IV SOLUTION) IMPLANT
YANKAUER SUCT BULB TIP NO VENT (SUCTIONS) ×2 IMPLANT

## 2015-04-08 NOTE — ED Notes (Signed)
C/o large abscess to posterior L hand since Saturday. [facility]
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
## 2015-04-08 NOTE — Op Note (Signed)
[id number] 
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
## 2015-04-08 NOTE — Anesthesia Procedure Notes (Signed)
Procedure Name: Intubation Date/Time: 04/08/2015 9:56 PM Performed by: Arlice ColtMANESS, Grace Haggart B Pre-anesthesia Checklist: Patient identified, Emergency Drugs available, Suction available, Patient being monitored and Timeout performed Patient Re-evaluated:Patient Re-evaluated prior to inductionOxygen Delivery Method: Circle system utilized Preoxygenation: Pre-oxygenation with 100% oxygen Intubation Type: IV induction and Rapid sequence Laryngoscope Size: Mac and 3 Grade View: Grade I Tube type: Oral Tube size: 7.0 mm Number of attempts: 1 Airway Equipment and Method: Stylet Placement Confirmation: ETT inserted through vocal cords under direct vision,  positive ETCO2 and breath sounds checked- equal and bilateral Secured at: 21 cm Tube secured with: Tape Dental Injury: Teeth and Oropharynx as per pre-operative assessment

## 2015-04-08 NOTE — Anesthesia Postprocedure Evaluation (Signed)
Anesthesia Post Note  Patient: Marissa Calderon  Procedure(s) Performed: Procedure(s) (LRB): IRRIGATION AND DEBRIDEMENT LEFT HAND (Left)  Anesthesia type: General  Patient location: PACU  Post pain: Pain level controlled  Post assessment: Post-op Vital signs reviewed  Last Vitals: BP 116/66 mmHg  Pulse 83  Temp(Src) 36.6 C (Oral)  Resp 13  Ht 5\' 5"  (1.651 m)  Wt 118 lb 11.2 oz (53.842 kg)  BMI 19.75 kg/m2  SpO2 100%  LMP 03/14/2015  Post vital signs: Reviewed  Level of consciousness: sedated  Complications: No apparent anesthesia complications

## 2015-04-08 NOTE — Discharge Instructions (Signed)

## 2015-04-08 NOTE — ED Notes (Signed)
Was told to re-call hand surgery per Dr. Elesa MassedWard for Dr. Steele SizerKuzma-spoke with Susie.

## 2015-04-08 NOTE — Brief Op Note (Signed)
04/08/2015  10:28 PM  PATIENT:  Marissa Calderon  24 y.o. female  PRE-OPERATIVE DIAGNOSIS:  infected left hand  POST-OPERATIVE DIAGNOSIS:  INFECTED LEFT HAND  PROCEDURE:  Procedure(s): IRRIGATION AND DEBRIDEMENT LEFT HAND (Left)  SURGEON:  Surgeon(s) and Role:    * Betha LoaKevin Wendell Nicoson, MD - Primary  PHYSICIAN ASSISTANT:   ASSISTANTS: none   ANESTHESIA:   general  EBL:     BLOOD ADMINISTERED:none  DRAINS: iodoform packing  LOCAL MEDICATIONS USED:  MARCAINE     SPECIMEN:  Source of Specimen:  left hand  DISPOSITION OF SPECIMEN:  micro  COUNTS:  YES  TOURNIQUET:   Total Tourniquet Time Documented: area (laterality) - 19 minutes Total: area (laterality) - 19 minutes   DICTATION: .Other Dictation: Dictation Number [id number]  PLAN OF CARE: Discharge to home after PACU  PATIENT DISPOSITION:  PACU - hemodynamically stable.
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
## 2015-04-08 NOTE — Transfer of Care (Signed)
Immediate Anesthesia Transfer of Care Note  Patient: Marissa Calderon  Procedure(s) Performed: Procedure(s): IRRIGATION AND DEBRIDEMENT LEFT HAND (Left)  Patient Location: PACU  Anesthesia Type:General  Level of Consciousness: awake, alert  and oriented  Airway & Oxygen Therapy: Patient Spontanous Breathing  Post-op Assessment: Report given to RN and Post -op Vital signs reviewed and stable  Post vital signs: Reviewed and stable  Last Vitals:  Filed Vitals:   04/08/15 2030  BP: 103/53  Pulse: 69  Temp:   Resp:     Complications: No apparent anesthesia complications

## 2015-04-08 NOTE — Anesthesia Preprocedure Evaluation (Signed)
Anesthesia Evaluation  Patient identified by MRN, date of birth, ID band Patient awake    Reviewed: Allergy & Precautions, NPO status , Patient's Chart, lab work & pertinent test results  Airway Mallampati: II  TM Distance: >3 FB Neck ROM: Full    Dental no notable dental hx.    Pulmonary Current Smoker,  breath sounds clear to auscultation  Pulmonary exam normal       Cardiovascular negative cardio ROS Normal cardiovascular examRhythm:Regular Rate:Normal     Neuro/Psych PSYCHIATRIC DISORDERS Anxiety negative neurological ROS     GI/Hepatic negative GI ROS, Neg liver ROS,   Endo/Other  negative endocrine ROS  Renal/GU negative Renal ROS     Musculoskeletal negative musculoskeletal ROS (+)   Abdominal   Peds  Hematology negative hematology ROS (+) anemia ,   Anesthesia Other Findings   Reproductive/Obstetrics negative OB ROS                             Anesthesia Physical Anesthesia Plan  ASA: II  Anesthesia Plan: General   Post-op Pain Management:    Induction: Intravenous  Airway Management Planned: LMA and Oral ETT  Additional Equipment: None  Intra-op Plan:   Post-operative Plan: Extubation in OR  Informed Consent: I have reviewed the patients History and Physical, chart, labs and discussed the procedure including the risks, benefits and alternatives for the proposed anesthesia with the patient or authorized representative who has indicated his/her understanding and acceptance.   Dental advisory given  Plan Discussed with: CRNA  Anesthesia Plan Comments:         Anesthesia Quick Evaluation
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
## 2015-04-11 ENCOUNTER — Encounter (HOSPITAL_COMMUNITY): Payer: Self-pay | Admitting: Orthopedic Surgery

## 2015-04-13 LAB — CULTURE, ROUTINE-ABSCESS

## 2015-04-14 LAB — ANAEROBIC CULTURE

## 2015-06-24 ENCOUNTER — Encounter (HOSPITAL_COMMUNITY): Payer: Self-pay | Admitting: Emergency Medicine

## 2015-06-24 ENCOUNTER — Emergency Department (INDEPENDENT_AMBULATORY_CARE_PROVIDER_SITE_OTHER)
Admission: EM | Admit: 2015-06-24 | Discharge: 2015-06-24 | Disposition: A | Discharge status: Home / Self Care | Payer: Self-pay | Source: Home / Self Care | Attending: Family Medicine | Admitting: Family Medicine

## 2015-06-24 DIAGNOSIS — I889 Nonspecific lymphadenitis, unspecified: Secondary | ICD-10-CM

## 2015-06-24 DIAGNOSIS — G43009 Migraine without aura, not intractable, without status migrainosus: Secondary | ICD-10-CM

## 2015-06-24 LAB — POCT PREGNANCY, URINE: Preg Test, Ur: NEGATIVE

## 2015-06-24 LAB — POCT URINALYSIS DIP (DEVICE)
BILIRUBIN URINE: NEGATIVE
Glucose, UA: NEGATIVE mg/dL
Hgb urine dipstick: NEGATIVE
Ketones, ur: NEGATIVE mg/dL
NITRITE: NEGATIVE
PROTEIN: NEGATIVE mg/dL
SPECIFIC GRAVITY, URINE: 1.025 (ref 1.005–1.030)
Urobilinogen, UA: 0.2 mg/dL (ref 0.0–1.0)
pH: 7 (ref 5.0–8.0)

## 2015-06-24 MED ORDER — ACETAMINOPHEN 325 MG PO TABS
ORAL_TABLET | ORAL | Status: AC
  Filled 2015-06-24: qty 3

## 2015-06-24 MED ORDER — ONDANSETRON 4 MG PO TBDP
ORAL_TABLET | ORAL | Status: AC
  Filled 2015-06-24: qty 1

## 2015-06-24 MED ORDER — DIPHENHYDRAMINE HCL 25 MG PO CAPS
ORAL_CAPSULE | ORAL | Status: AC
  Filled 2015-06-24: qty 1

## 2015-06-24 MED ORDER — ONDANSETRON 4 MG PO TBDP
4.0000 mg | ORAL_TABLET | Freq: Once | ORAL | Status: AC
  Administered 2015-06-24: 4 mg via ORAL

## 2015-06-24 MED ORDER — DEXAMETHASONE 2 MG PO TABS
ORAL_TABLET | ORAL | Status: AC
  Filled 2015-06-24: qty 1

## 2015-06-24 MED ORDER — DEXAMETHASONE 4 MG PO TABS
10.0000 mg | ORAL_TABLET | Freq: Once | ORAL | Status: AC
  Administered 2015-06-24: 10 mg via ORAL

## 2015-06-24 MED ORDER — DIPHENHYDRAMINE HCL 25 MG PO CAPS
25.0000 mg | ORAL_CAPSULE | Freq: Once | ORAL | Status: AC
  Administered 2015-06-24: 25 mg via ORAL

## 2015-06-24 MED ORDER — DEXAMETHASONE 4 MG PO TABS
ORAL_TABLET | ORAL | Status: AC
  Filled 2015-06-24: qty 2

## 2015-06-24 MED ORDER — ACETAMINOPHEN 500 MG PO TABS: 1000.0000 mg | ORAL_TABLET | Freq: Once | ORAL | Status: DC

## 2015-06-24 MED ORDER — ACETAMINOPHEN 325 MG PO TABS
975.0000 mg | ORAL_TABLET | Freq: Once | ORAL | Status: AC
  Administered 2015-06-24: 975 mg via ORAL

## 2015-06-24 NOTE — ED Notes (Signed)
Pt c/o bumps on back of scalp that's causing headache onset 3 days Denies fevers, chills... Pain increases to the touch Alert, no signs acute distress.

## 2015-06-24 NOTE — Discharge Instructions (Signed)
You have developed a migraine headache. Your given several medicines to help treat this today including Decadron, Tylenol, Benadryl, and Zofran. Please get plenty of rest today. Please follow-up with emergency room if your symptoms get worse. The bumps on the back of her head will likely take a few days to possibly even weeks to go away. They last longer than this please go to your regular doctor for possible biopsy.
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
## 2015-09-15 ENCOUNTER — Emergency Department (HOSPITAL_COMMUNITY)
Admission: EM | Admit: 2015-09-15 | Discharge: 2015-09-15 | Disposition: A | Discharge status: Home / Self Care | Payer: Self-pay | Source: Home / Self Care | Attending: Family Medicine | Admitting: Family Medicine

## 2015-09-15 ENCOUNTER — Encounter (HOSPITAL_COMMUNITY): Payer: Self-pay | Admitting: *Deleted

## 2015-09-15 DIAGNOSIS — O219 Vomiting of pregnancy, unspecified: Secondary | ICD-10-CM

## 2015-09-15 LAB — POCT URINALYSIS DIP (DEVICE)
Glucose, UA: NEGATIVE mg/dL
HGB URINE DIPSTICK: NEGATIVE
KETONES UR: 40 mg/dL — AB
Nitrite: NEGATIVE
PH: 6 (ref 5.0–8.0)
Protein, ur: NEGATIVE mg/dL
SPECIFIC GRAVITY, URINE: 1.025 (ref 1.005–1.030)
Urobilinogen, UA: 0.2 mg/dL (ref 0.0–1.0)

## 2015-09-15 LAB — POCT PREGNANCY, URINE: Preg Test, Ur: POSITIVE — AB

## 2015-09-15 MED ORDER — ONDANSETRON HCL 4 MG PO TABS: 4.0000 mg | ORAL_TABLET | Freq: Four times a day (QID) | ORAL | Status: DC

## 2015-09-15 MED ORDER — ONDANSETRON 4 MG PO TBDP
4.0000 mg | ORAL_TABLET | Freq: Once | ORAL | Status: AC
  Administered 2015-09-15: 4 mg via ORAL

## 2015-09-15 MED ORDER — ONDANSETRON 4 MG PO TBDP
ORAL_TABLET | ORAL | Status: AC
  Filled 2015-09-15: qty 1

## 2015-09-15 NOTE — ED Notes (Signed)
Pt  Has  Been vomiting  X  3  Days     -    Late  On  Her  Period  No  Birth   Control     Sexually  Active

## 2015-09-15 NOTE — ED Provider Notes (Signed)
CSN: [id number]     Arrival date & time 09/15/15  1443 History   First MD Initiated Contact with Patient 09/15/15 1523     Chief Complaint  Patient presents with  . Emesis   (Consider location/radiation/quality/duration/timing/severity/associated sxs/prior Treatment) Patient is a 24 y.o. female presenting with vomiting. The history is provided by the patient.  Emesis Severity:  Moderate Duration:  3 days Quality:  Stomach contents Progression:  Unchanged Chronicity:  New Relieved by:  Nothing Worsened by:  Nothing tried Ineffective treatments:  None tried Associated symptoms: no abdominal pain and no diarrhea   Associated symptoms comment:  Lmp is 9/10, no birth control/ Risk factors: pregnant now   Risk factors: no sick contacts     Past Medical History  Diagnosis Date  . Anxiety   . PTSD   . Anemia    Past Surgical History  Procedure Laterality Date  . Therapeutic abortion  june 2013  . Wisdom tooth extraction  2012  . I&d extremity Left 04/08/2015    Procedure: IRRIGATION AND DEBRIDEMENT LEFT HAND;  Surgeon: Betha Loa, MD;  Location: [facility] OR;  Service: Orthopedics;  Laterality: Left;   Family History  Problem Relation Age of Onset  . Kidney Stones Mother   . Hypertension Father    Social History  Substance Use Topics  . Smoking status: Current Every Day Smoker -- 0.10 packs/day    Types: Cigarettes  . Smokeless tobacco: None  . Alcohol Use: No   OB History    Gravida Para Term Preterm AB TAB SAB Ectopic Multiple Living   0     Review of Systems  Gastrointestinal: Positive for vomiting. Negative for abdominal pain, diarrhea and constipation.  Genitourinary: Positive for menstrual problem. Negative for dysuria, frequency, flank pain, vaginal bleeding and vaginal discharge.  All other systems reviewed and are negative.   Allergies  Review of patient's allergies indicates no known allergies.  Home Medications   Prior to Admission medications    Medication Sig Start Date End Date Taking? Authorizing Provider  Ascorbic Acid (VITAMIN C) 1000 MG tablet Take 1,000 mg by mouth daily.    Historical Provider, MD  cholecalciferol (VITAMIN D) 1000 UNITS tablet Take 1,000 Units by mouth daily.    Historical Provider, MD  ondansetron (ZOFRAN) 4 MG tablet Take 1 tablet (4 mg total) by mouth every 6 (six) hours. Prn vomiting 09/15/15   Linna Hoff, MD   Meds Ordered and Administered this Visit   Medications  ondansetron (ZOFRAN-ODT) disintegrating tablet 4 mg (not administered)    BP 129/75 mmHg  Pulse 70  Temp(Src) 98 F (36.7 C) (Oral)  Resp 20  SpO2 98%  LMP 08/06/2015 No data found.   Physical Exam  Constitutional: She is oriented to person, place, and time. She appears well-developed and well-nourished.  Abdominal: Soft. Bowel sounds are normal. She exhibits no mass. There is no tenderness. There is no rebound and no guarding.  Neurological: She is alert and oriented to person, place, and time.  Skin: Skin is warm and dry.  Nursing note and vitals reviewed.   ED Course  Procedures (including critical care time)  Labs Review Labs Reviewed  POCT URINALYSIS DIP (DEVICE) - Abnormal; Notable for the following:    Bilirubin Urine SMALL (*)    Ketones, ur 40 (*)    Leukocytes, UA SMALL ([id number] other components within normal limits  POCT PREGNANCY, URINE -  Abnormal; Notable for the following:    Preg Test, Ur POSITIVE (*)    All other components within normal limits    Imaging Review No results found.   Visual Acuity Review  Right Eye Distance:   Left Eye Distance:   Bilateral Distance:    Right Eye Near:   Left Eye Near:    Bilateral Near:         MDM  rx  For zofran of preg induced vomiting.    Linna HoffJames D Cassey Hurrell, MD 09/15/15 [phone]

## 2015-09-15 NOTE — Discharge Instructions (Signed)
Use medicine as needed, drink plenty of fluids, see your doctor for prenatal care.

## 2015-12-28 ENCOUNTER — Emergency Department (HOSPITAL_COMMUNITY)
Admission: EM | Admit: 2015-12-28 | Discharge: 2015-12-29 | Disposition: A | Discharge status: Home / Self Care | Payer: Medicaid Other

## 2016-02-18 ENCOUNTER — Encounter (HOSPITAL_COMMUNITY): Payer: Self-pay

## 2016-02-18 ENCOUNTER — Emergency Department (HOSPITAL_COMMUNITY): Payer: Medicaid Other

## 2016-02-18 ENCOUNTER — Emergency Department (HOSPITAL_COMMUNITY)
Admission: EM | Admit: 2016-02-18 | Discharge: 2016-02-18 | Disposition: A | Discharge status: Home / Self Care | Payer: Medicaid Other | Attending: Emergency Medicine | Admitting: Emergency Medicine

## 2016-02-18 DIAGNOSIS — Z3202 Encounter for pregnancy test, result negative: Secondary | ICD-10-CM | POA: Insufficient documentation

## 2016-02-18 DIAGNOSIS — F419 Anxiety disorder, unspecified: Secondary | ICD-10-CM | POA: Insufficient documentation

## 2016-02-18 DIAGNOSIS — R0789 Other chest pain: Secondary | ICD-10-CM | POA: Insufficient documentation

## 2016-02-18 DIAGNOSIS — Z862 Personal history of diseases of the blood and blood-forming organs and certain disorders involving the immune mechanism: Secondary | ICD-10-CM | POA: Insufficient documentation

## 2016-02-18 DIAGNOSIS — R0602 Shortness of breath: Secondary | ICD-10-CM | POA: Insufficient documentation

## 2016-02-18 DIAGNOSIS — F17210 Nicotine dependence, cigarettes, uncomplicated: Secondary | ICD-10-CM | POA: Insufficient documentation

## 2016-02-18 LAB — CBC
HCT: 39.5 % (ref 36.0–46.0)
HEMOGLOBIN: 12.9 g/dL (ref 12.0–15.0)
MCH: 30.2 pg (ref 26.0–34.0)
MCHC: 32.7 g/dL (ref 30.0–36.0)
MCV: 92.5 fL (ref 78.0–100.0)
Platelets: 267 10*3/uL (ref 150–400)
RBC: 4.27 MIL/uL (ref 3.87–5.11)
RDW: 12.5 % (ref 11.5–15.5)
WBC: 7 10*3/uL (ref 4.0–10.5)

## 2016-02-18 LAB — BASIC METABOLIC PANEL
ANION GAP: 9 (ref 5–15)
BUN: 6 mg/dL (ref 6–20)
CHLORIDE: 106 mmol/L (ref 101–111)
CO2: 27 mmol/L (ref 22–32)
Calcium: 9.2 mg/dL (ref 8.9–10.3)
Creatinine, Ser: 0.51 mg/dL (ref 0.44–1.00)
GFR calc non Af Amer: >60 mL/min (ref >60)
Glucose, Bld: 103 mg/dL — ABNORMAL HIGH (ref 65–99)
Potassium: 4.2 mmol/L (ref 3.5–5.1)
Sodium: 142 mmol/L (ref 135–145)

## 2016-02-18 LAB — I-STAT BETA HCG BLOOD, ED (MC, WL, AP ONLY): I-stat hCG, quantitative: <5 m[IU]/mL (ref <5)

## 2016-02-18 LAB — I-STAT TROPONIN, ED: Troponin i, poc: 0 ng/mL (ref 0.00–0.08)

## 2016-02-18 MED ORDER — NAPROXEN 500 MG PO TABS: 500.0000 mg | ORAL_TABLET | Freq: Two times a day (BID) | ORAL | Status: DC

## 2016-02-18 MED ORDER — KETOROLAC TROMETHAMINE 30 MG/ML IJ SOLN
60.0000 mg | Freq: Once | INTRAMUSCULAR | Status: AC
  Administered 2016-02-18: 60 mg via INTRAMUSCULAR
  Filled 2016-02-18: qty 2

## 2016-02-18 MED ORDER — HYDROXYZINE HCL 25 MG PO TABS: 25.0000 mg | ORAL_TABLET | Freq: Four times a day (QID) | ORAL | Status: DC | PRN

## 2016-02-18 NOTE — ED Notes (Signed)
Pt here with c/o stabbing chest pain under left breast associated with SOB. She reports the pain started about a month ago but has become worse.

## 2016-02-18 NOTE — Discharge Instructions (Signed)
Chest Wall Pain °Chest wall pain is pain in or around the bones and muscles of your chest. Sometimes, an injury causes this pain. Sometimes, the cause may not be known. This pain may take several weeks or longer to get better. °HOME CARE INSTRUCTIONS  °Pay attention to any changes in your symptoms. Take these actions to help with your pain:  °· Rest as told by your health care provider.   °· Avoid activities that cause pain. These include any activities that use your chest muscles or your abdominal and side muscles to lift heavy items.    °· If directed, apply ice to the painful area: °· Put ice in a plastic bag. °· Place a towel between your skin and the bag. °· Leave the ice on for 20 minutes, 2-3 times per day. °· Take over-the-counter and prescription medicines only as told by your health care provider. °· Do not use tobacco products, including cigarettes, chewing tobacco, and e-cigarettes. If you need help quitting, ask your health care provider. °· Keep all follow-up visits as told by your health care provider. This is important. °SEEK MEDICAL CARE IF: °· You have a fever. °· Your chest pain becomes worse. °· You have new symptoms. °SEEK IMMEDIATE MEDICAL CARE IF: °· You have nausea or vomiting. °· You feel sweaty or light-headed. °· You have a cough with phlegm (sputum) or you cough up blood. °· You develop shortness of breath. °  °This information is not intended to replace advice given to you by your health care provider. Make sure you discuss any questions you have with your health care provider. °  °Document Released: 11/12/2005 Document Revised: 08/03/2015 Document Reviewed: 02/07/2015 °Elsevier Interactive Patient Education ©2016 [employer] ° °Panic Attacks °Panic attacks are sudden, short-lived surges of severe anxiety, fear, or discomfort. They may occur for no reason when you are relaxed, when you are anxious, or when you are sleeping. Panic attacks may occur for a number of reasons:  °· Healthy  people occasionally have panic attacks in extreme, life-threatening situations, such as war or natural disasters. Normal anxiety is a protective mechanism of the body that helps us react to danger (fight or flight response). °· Panic attacks are often seen with anxiety disorders, such as panic disorder, social anxiety disorder, generalized anxiety disorder, and phobias. Anxiety disorders cause excessive or uncontrollable anxiety. They may interfere with your relationships or other life activities. °· Panic attacks are sometimes seen with other mental illnesses, such as depression and posttraumatic stress disorder. °· Certain medical conditions, prescription medicines, and drugs of abuse can cause panic attacks. °SYMPTOMS  °Panic attacks start suddenly, peak within 20 minutes, and are accompanied by four or more of the following symptoms: °· Pounding heart or fast heart rate (palpitations). °· Sweating. °· Trembling or shaking. °· Shortness of breath or feeling smothered. °· Feeling choked. °· Chest pain or discomfort. °· Nausea or strange feeling in your stomach. °· Dizziness, light-headedness, or feeling like you will faint. °· Chills or hot flushes. °· Numbness or tingling in your lips or hands and feet. °· Feeling that things are not real or feeling that you are not yourself. °· Fear of losing control or going crazy. °· Fear of dying. °Some of these symptoms can mimic serious medical conditions. For example, you may think you are having a heart attack. Although panic attacks can be very scary, they are not life threatening. °DIAGNOSIS  °Panic attacks are diagnosed through an assessment by your health care provider. Your health care   provider will ask questions about your symptoms, such as where and when they occurred. Your health care provider will also ask about your medical history and use of alcohol and drugs, including prescription medicines. Your health care provider may order blood tests or other studies to  rule out a serious medical condition. Your health care provider may refer you to a mental health professional for further evaluation. °TREATMENT  °· Most healthy people who have one or two panic attacks in an extreme, life-threatening situation will not require treatment. °· The treatment for panic attacks associated with anxiety disorders or other mental illness typically involves counseling with a mental health professional, medicine, or a combination of both. Your health care provider will help determine what treatment is best for you. °· Panic attacks due to physical illness usually go away with treatment of the illness. If prescription medicine is causing panic attacks, talk with your health care provider about stopping the medicine, decreasing the dose, or substituting another medicine. °· Panic attacks due to alcohol or drug abuse go away with abstinence. Some adults need professional help in order to stop drinking or using drugs. °HOME CARE INSTRUCTIONS  °· Take all medicines as directed by your health care provider.   °· Schedule and attend follow-up visits as directed by your health care provider. It is important to keep all your appointments. °SEEK MEDICAL CARE IF: °· You are not able to take your medicines as prescribed. °· Your symptoms do not improve or get worse. °SEEK IMMEDIATE MEDICAL CARE IF:  °· You experience panic attack symptoms that are different than your usual symptoms. °· You have serious thoughts about hurting yourself or others. °· You are taking medicine for panic attacks and have a serious side effect. °MAKE SURE YOU: °· Understand these instructions. °· Will watch your condition. °· Will get help right away if you are not doing well or get worse. °  °This information is not intended to replace advice given to you by your health care provider. Make sure you discuss any questions you have with your health care provider. °  °Document Released: 11/12/2005 Document Revised: 11/17/2013  Document Reviewed: 06/26/2013 °Elsevier Interactive Patient Education ©2016 Elsevier Inc. ° °
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
## 2016-02-18 NOTE — ED Provider Notes (Signed)
By signing my name below, I, Evon Slackerrance Branch, attest that this documentation has been prepared under the direction and in the presence of Enbridge EnergyKristen N Musette Kisamore, DO. Electronically Signed: Evon Slackerrance Branch, ED Scribe. 02/18/2016. 3:50 AM.  TIME SEEN: 3:45 AM  CHIEF COMPLAINT: CP and SOB  HPI: HPI Comments: Marissa Calderon is a 25 y.o. female with PMHx anxiety who presents to the Emergency Department complaining of CP and SOB onset several months prior that have been intermittent until recently. Pt reports that her symptoms have worsened over the last 2 weeks and have become more constant. She states that her symptoms are worse with exertion, movement. Pt reports that her symptoms feel as if she is having a panic attack with associated palpitations. Pt states she has tried her boyfriends inhaler with no relief. No history of PE, DVT, exogenous estrogen use, fracture, surgery, trauma, hospitalization, prolonged travel. No lower extremity swelling or pain. No calf tenderness. Denies illicit drug use. Denies caffeine use. Denies SI/HI or hallucinations.   ROS: See HPI Constitutional: no fever  Eyes: no drainage  ENT: no runny nose   Cardiovascular:   chest pain  Resp:  SOB  GI: no vomiting GU: no dysuria Integumentary: no rash  Allergy: no hives  Musculoskeletal: no leg swelling  Neurological: no slurred speech ROS otherwise negative  PAST MEDICAL HISTORY/PAST SURGICAL HISTORY:  Past Medical History  Diagnosis Date  . Anxiety   . PTSD   . Anemia     MEDICATIONS:  Prior to Admission medications   Medication Sig Start Date End Date Taking? Authorizing Provider  Ascorbic Acid (VITAMIN C) 1000 MG tablet Take 1,000 mg by mouth daily.    Historical Provider, MD  cholecalciferol (VITAMIN D) 1000 UNITS tablet Take 1,000 Units by mouth daily.    Historical Provider, MD  ondansetron (ZOFRAN) 4 MG tablet Take 1 tablet (4 mg total) by mouth every 6 (six) hours. Prn vomiting 09/15/15   Linna HoffJames D  Kindl, MD    ALLERGIES:  No Known Allergies  SOCIAL HISTORY:  Social History  Substance Use Topics  . Smoking status: Current Every Day Smoker -- 0.10 packs/day    Types: Cigarettes  . Smokeless tobacco: Not on file  . Alcohol Use: No    FAMILY HISTORY: Family History  Problem Relation Age of Onset  . Kidney Stones Mother   . Hypertension Father     EXAM: BP 128/75 mmHg  Pulse 95  Temp(Src) 98 F (36.7 C) (Oral)  Resp 16  Ht 5\' 4"  (1.626 m)  Wt 123 lb (55.792 kg)  BMI 21.10 kg/m2  SpO2 99%  LMP  (LMP Unknown) CONSTITUTIONAL: Alert and oriented and responds appropriately to questions. Well-appearing; well-nourished HEAD: Normocephalic EYES: Conjunctivae clear, PERRL ENT: normal nose; no rhinorrhea; moist mucous membranes NECK: Supple, no meningismus, no LAD  CARD: RRR; S1 and S2 appreciated; no murmurs, no clicks, no rubs, no gallops CHEST:  Tender to palpation over the center of her chest without crepitus, ecchymosis or deformity. No rash appreciated. RESP: Normal chest excursion without splinting or tachypnea; breath sounds clear and equal bilaterally; no wheezes, no rhonchi, no rales, no hypoxia or respiratory distress, speaking full sentences ABD/GI: Normal bowel sounds; non-distended; soft, non-tender, no rebound, no guarding, no peritoneal signs BACK:  The back appears normal and is non-tender to palpation, there is no CVA tenderness EXT: Normal ROM in all joints; non-tender to palpation; no edema; normal capillary refill; no cyanosis, no calf tenderness or swelling  SKIN: Normal color for age and race; warm; no rash NEURO: Moves all extremities equally, sensation to light touch intact diffusely, cranial nerves II through XII intact PSYCH: Patient is tearful and appears anxious. She denies SI, HI, hallucinations. Grooming and personal hygiene are appropriate.  MEDICAL DECISION MAKING: Patient here with atypical chest pain. No risk factors for ACS or pulmonary  embolus. Doubt dissection. Doubt myocarditis. Doubt esophageal rupture. Suspect that this is chest wall pain versus anxiety. Workup in the emergency department has been unremarkable. We'll discharge with prescription for Vistaril and naproxen to take as needed. Have provided her with outpatient follow-up. She is anxious but has no emergent psychiatric safety concerns.  At this time, I do not feel there is any life-threatening condition present. I feel the patient is safe to be discharged home. I have reviewed and discussed all results (EKG, imaging, lab, urine as appropriate), exam findings with patient. I have reviewed nursing notes and appropriate previous records.  I feel the patient is safe to be discharged home without further emergent workup. Discussed usual and customary return precautions. Patient and family (if present) verbalize understanding and are comfortable with this plan.  Patient will follow-up with their primary care provider. If they do not have a primary care provider, information for follow-up has been provided to them. All questions have been answered.    EKG Interpretation  Date/Time:  Saturday February 18 2016 02:54:42 EDT Ventricular Rate:  80 PR Interval:  112 QRS Duration: 76 QT Interval:  368 QTC Calculation: 424 R Axis:   88 Text Interpretation:  Normal sinus rhythm Normal ECG No old tracing to compare Confirmed by Shemia Bevel,  DO, Travaris Kosh ([id number]) on 02/18/2016 3:30:36 AM        I personally performed the services described in this documentation, which was scribed in my presence. The recorded information has been reviewed and is accurate.    Layla Maw Mackay Hanauer, DO 02/18/16 [phone]

## 2016-03-09 ENCOUNTER — Encounter: Payer: Self-pay | Admitting: Emergency Medicine

## 2016-03-09 ENCOUNTER — Emergency Department: Payer: Medicaid Other

## 2016-03-09 ENCOUNTER — Emergency Department
Admission: EM | Admit: 2016-03-09 | Discharge: 2016-03-09 | Disposition: A | Discharge status: Home / Self Care | Payer: Self-pay | Attending: Emergency Medicine | Admitting: Emergency Medicine

## 2016-03-09 DIAGNOSIS — Y92410 Unspecified street and highway as the place of occurrence of the external cause: Secondary | ICD-10-CM | POA: Insufficient documentation

## 2016-03-09 DIAGNOSIS — M25522 Pain in left elbow: Secondary | ICD-10-CM | POA: Insufficient documentation

## 2016-03-09 DIAGNOSIS — Z5321 Procedure and treatment not carried out due to patient leaving prior to being seen by health care provider: Secondary | ICD-10-CM | POA: Insufficient documentation

## 2016-03-09 DIAGNOSIS — Y9389 Activity, other specified: Secondary | ICD-10-CM | POA: Insufficient documentation

## 2016-03-09 DIAGNOSIS — F17210 Nicotine dependence, cigarettes, uncomplicated: Secondary | ICD-10-CM | POA: Insufficient documentation

## 2016-03-09 DIAGNOSIS — S069X9A Unspecified intracranial injury with loss of consciousness of unspecified duration, initial encounter: Secondary | ICD-10-CM | POA: Insufficient documentation

## 2016-03-09 DIAGNOSIS — Y999 Unspecified external cause status: Secondary | ICD-10-CM | POA: Insufficient documentation
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
## 2016-03-10 ENCOUNTER — Encounter: Payer: Self-pay | Admitting: Emergency Medicine

## 2016-03-10 ENCOUNTER — Emergency Department
Admission: EM | Admit: 2016-03-10 | Discharge: 2016-03-10 | Disposition: A | Discharge status: Home / Self Care | Payer: No Typology Code available for payment source | Attending: Emergency Medicine | Admitting: Emergency Medicine

## 2016-03-10 DIAGNOSIS — L03119 Cellulitis of unspecified part of limb: Secondary | ICD-10-CM

## 2016-03-10 DIAGNOSIS — Y939 Activity, unspecified: Secondary | ICD-10-CM | POA: Diagnosis not present

## 2016-03-10 DIAGNOSIS — M25532 Pain in left wrist: Secondary | ICD-10-CM | POA: Diagnosis present

## 2016-03-10 DIAGNOSIS — S161XXA Strain of muscle, fascia and tendon at neck level, initial encounter: Secondary | ICD-10-CM | POA: Diagnosis not present

## 2016-03-10 DIAGNOSIS — Y929 Unspecified place or not applicable: Secondary | ICD-10-CM | POA: Insufficient documentation

## 2016-03-10 DIAGNOSIS — F17210 Nicotine dependence, cigarettes, uncomplicated: Secondary | ICD-10-CM | POA: Insufficient documentation

## 2016-03-10 DIAGNOSIS — L03114 Cellulitis of left upper limb: Secondary | ICD-10-CM | POA: Diagnosis not present

## 2016-03-10 DIAGNOSIS — Y999 Unspecified external cause status: Secondary | ICD-10-CM | POA: Diagnosis not present

## 2016-03-10 MED ORDER — HYDROMORPHONE HCL 1 MG/ML IJ SOLN
1.0000 mg | Freq: Once | INTRAMUSCULAR | Status: AC
Start: 2016-03-10 — End: 2016-03-10
  Administered 2016-03-10: 1 mg via INTRAMUSCULAR
  Filled 2016-03-10: qty 1

## 2016-03-10 MED ORDER — CEPHALEXIN 500 MG PO CAPS
500.0000 mg | ORAL_CAPSULE | Freq: Four times a day (QID) | ORAL | Status: DC
Start: 2016-03-10 — End: 2016-04-12

## 2016-03-10 MED ORDER — OXYCODONE-ACETAMINOPHEN 5-325 MG PO TABS: 1.0000 | ORAL_TABLET | Freq: Four times a day (QID) | ORAL | Status: DC | PRN

## 2016-03-10 MED ORDER — ONDANSETRON 4 MG PO TBDP
4.0000 mg | ORAL_TABLET | Freq: Once | ORAL | Status: AC
  Administered 2016-03-10: 4 mg via ORAL
  Filled 2016-03-10: qty 1

## 2016-03-10 MED ORDER — IBUPROFEN 800 MG PO TABS: 800.0000 mg | ORAL_TABLET | Freq: Three times a day (TID) | ORAL | Status: DC | PRN

## 2016-03-10 MED ORDER — OXYCODONE-ACETAMINOPHEN 5-325 MG PO TABS
2.0000 | ORAL_TABLET | Freq: Once | ORAL | Status: AC
Start: 2016-03-10 — End: 2016-03-10
  Administered 2016-03-10: 2 via ORAL
  Filled 2016-03-10: qty 2

## 2016-03-10 MED ORDER — IBUPROFEN 800 MG PO TABS
800.0000 mg | ORAL_TABLET | Freq: Once | ORAL | Status: AC
  Administered 2016-03-10: 800 mg via ORAL
  Filled 2016-03-10 (×2): qty 1

## 2016-03-10 MED ORDER — CEFTRIAXONE SODIUM 1 G IJ SOLR
1.0000 g | Freq: Once | INTRAMUSCULAR | Status: AC
Start: 2016-03-10 — End: 2016-03-10
  Administered 2016-03-10: 1 g via INTRAMUSCULAR
  Filled 2016-03-10: qty 10

## 2016-03-10 MED ORDER — CYCLOBENZAPRINE HCL 10 MG PO TABS: 10.0000 mg | ORAL_TABLET | Freq: Three times a day (TID) | ORAL | Status: DC | PRN

## 2016-03-10 MED ORDER — LIDOCAINE HCL (PF) 1 % IJ SOLN
5.0000 mL | Freq: Once | INTRAMUSCULAR | Status: AC
  Administered 2016-03-10: 5 mL
  Filled 2016-03-10: qty 5
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
## 2016-03-11 ENCOUNTER — Encounter: Payer: Self-pay | Admitting: Emergency Medicine

## 2016-03-11 ENCOUNTER — Emergency Department
Admission: EM | Admit: 2016-03-11 | Discharge: 2016-03-11 | Disposition: A | Discharge status: Home / Self Care | Payer: Self-pay | Attending: Emergency Medicine | Admitting: Emergency Medicine

## 2016-03-11 ENCOUNTER — Emergency Department: Payer: Medicaid Other

## 2016-03-11 DIAGNOSIS — F4310 Post-traumatic stress disorder, unspecified: Secondary | ICD-10-CM | POA: Insufficient documentation

## 2016-03-11 DIAGNOSIS — Z791 Long term (current) use of non-steroidal anti-inflammatories (NSAID): Secondary | ICD-10-CM | POA: Insufficient documentation

## 2016-03-11 DIAGNOSIS — Z7952 Long term (current) use of systemic steroids: Secondary | ICD-10-CM | POA: Insufficient documentation

## 2016-03-11 DIAGNOSIS — F419 Anxiety disorder, unspecified: Secondary | ICD-10-CM | POA: Insufficient documentation

## 2016-03-11 DIAGNOSIS — L03114 Cellulitis of left upper limb: Secondary | ICD-10-CM | POA: Insufficient documentation

## 2016-03-11 DIAGNOSIS — F17210 Nicotine dependence, cigarettes, uncomplicated: Secondary | ICD-10-CM | POA: Insufficient documentation

## 2016-03-11 LAB — CBC WITH DIFFERENTIAL/PLATELET
Basophils Absolute: 0 K/uL (ref 0–0.1)
Basophils Relative: 1 %
Eosinophils Absolute: 0 K/uL (ref 0–0.7)
Eosinophils Relative: 0 %
HCT: 35.7 % (ref 35.0–47.0)
Hemoglobin: 12 g/dL (ref 12.0–16.0)
Lymphocytes Relative: 33 %
Lymphs Abs: 2.7 K/uL (ref 1.0–3.6)
MCH: 29.9 pg (ref 26.0–34.0)
MCHC: 33.7 g/dL (ref 32.0–36.0)
MCV: 88.7 fL (ref 80.0–100.0)
Monocytes Absolute: 0.7 K/uL (ref 0.2–0.9)
Monocytes Relative: 8 %
Neutro Abs: 4.7 K/uL (ref 1.4–6.5)
Neutrophils Relative %: 58 %
Platelets: 232 K/uL (ref 150–440)
RBC: 4.02 MIL/uL (ref 3.80–5.20)
RDW: 12.7 % (ref 11.5–14.5)
WBC: 8.1 K/uL (ref 3.6–11.0)

## 2016-03-11 LAB — BASIC METABOLIC PANEL
Anion gap: 6 (ref 5–15)
BUN: 10 mg/dL (ref 6–20)
CALCIUM: 8.9 mg/dL (ref 8.9–10.3)
CHLORIDE: 105 mmol/L (ref 101–111)
CO2: 24 mmol/L (ref 22–32)
Creatinine, Ser: 0.72 mg/dL (ref 0.44–1.00)
Glucose, Bld: 89 mg/dL (ref 65–99)
POTASSIUM: 4.3 mmol/L (ref 3.5–5.1)
SODIUM: 135 mmol/L (ref 135–145)

## 2016-03-11 MED ORDER — CLINDAMYCIN PHOSPHATE 600 MG/50ML IV SOLN
600.0000 mg | Freq: Once | INTRAVENOUS | Status: AC
  Administered 2016-03-11: 600 mg via INTRAVENOUS
  Filled 2016-03-11: qty 50

## 2016-03-11 MED ORDER — HYDROMORPHONE HCL 1 MG/ML IJ SOLN
1.0000 mg | Freq: Once | INTRAMUSCULAR | Status: AC
Start: 2016-03-11 — End: 2016-03-11
  Administered 2016-03-11: 1 mg via INTRAVENOUS
  Filled 2016-03-11: qty 1

## 2016-03-11 MED ORDER — CLINDAMYCIN HCL 300 MG PO CAPS: 300.0000 mg | ORAL_CAPSULE | Freq: Four times a day (QID) | ORAL | Status: DC

## 2016-03-11 NOTE — ED Notes (Signed)
Pt coming into ED for wound check.  Pt sts she began antibiotics this AM.  Pt does have incr area of redness around wound to L elbow.
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
## 2016-03-11 NOTE — Discharge Instructions (Signed)
Cellulitis Cellulitis is an infection of the skin and the tissue beneath it. The infected area is usually red and tender. Cellulitis occurs most often in the arms and lower legs.  CAUSES  Cellulitis is caused by bacteria that enter the skin through cracks or cuts in the skin. The most common types of bacteria that cause cellulitis are staphylococci and streptococci. SIGNS AND SYMPTOMS   Redness and warmth.  Swelling.  Tenderness or pain.  Fever. DIAGNOSIS  Your health care provider can usually determine what is wrong based on a physical exam. Blood tests may also be done. TREATMENT  Treatment usually involves taking an antibiotic medicine. HOME CARE INSTRUCTIONS   Take your antibiotic medicine as directed by your health care provider. Finish the antibiotic even if you start to feel better.  Keep the infected arm or leg elevated to reduce swelling.  Apply a warm cloth to the affected area up to 4 times per day to relieve pain.  Take medicines only as directed by your health care provider.  Keep all follow-up visits as directed by your health care provider. SEEK MEDICAL CARE IF:   You notice red streaks coming from the infected area.  Your red area gets larger or turns dark in color.  Your bone or joint underneath the infected area becomes painful after the skin has healed.  Your infection returns in the same area or another area.  You notice a swollen bump in the infected area.  You develop new symptoms.  You have a fever. SEEK IMMEDIATE MEDICAL CARE IF:   You feel very sleepy.  You develop vomiting or diarrhea.  You have a general ill feeling (malaise) with muscle aches and pains.   This information is not intended to replace advice given to you by your health care provider. Make sure you discuss any questions you have with your health care provider.   Document Released: 08/22/2005 Document Revised: 08/03/2015 Document Reviewed: 01/28/2012 Elsevier Interactive  Patient Education 2016 [employer]   Take antibiotics as directed. Follow-up tomorrow for a wound recheck.
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
## 2016-03-14 LAB — WOUND CULTURE
CULTURE: NO GROWTH
Special Requests: NORMAL

## 2016-04-11 ENCOUNTER — Encounter (HOSPITAL_COMMUNITY): Payer: Self-pay

## 2016-04-11 ENCOUNTER — Emergency Department (HOSPITAL_COMMUNITY)
Admission: EM | Admit: 2016-04-11 | Discharge: 2016-04-12 | Disposition: A | Discharge status: Home / Self Care | Payer: No Typology Code available for payment source | Attending: Emergency Medicine | Admitting: Emergency Medicine

## 2016-04-11 DIAGNOSIS — F419 Anxiety disorder, unspecified: Secondary | ICD-10-CM | POA: Diagnosis not present

## 2016-04-11 DIAGNOSIS — M546 Pain in thoracic spine: Secondary | ICD-10-CM | POA: Diagnosis not present

## 2016-04-11 DIAGNOSIS — N390 Urinary tract infection, site not specified: Secondary | ICD-10-CM | POA: Diagnosis not present

## 2016-04-11 DIAGNOSIS — S161XXD Strain of muscle, fascia and tendon at neck level, subsequent encounter: Secondary | ICD-10-CM | POA: Diagnosis not present

## 2016-04-11 DIAGNOSIS — Z862 Personal history of diseases of the blood and blood-forming organs and certain disorders involving the immune mechanism: Secondary | ICD-10-CM | POA: Insufficient documentation

## 2016-04-11 DIAGNOSIS — Z792 Long term (current) use of antibiotics: Secondary | ICD-10-CM | POA: Diagnosis not present

## 2016-04-11 DIAGNOSIS — F17210 Nicotine dependence, cigarettes, uncomplicated: Secondary | ICD-10-CM | POA: Diagnosis not present

## 2016-04-11 DIAGNOSIS — M542 Cervicalgia: Secondary | ICD-10-CM | POA: Diagnosis present

## 2016-04-11 DIAGNOSIS — R0789 Other chest pain: Secondary | ICD-10-CM | POA: Diagnosis not present

## 2016-04-11 DIAGNOSIS — Z791 Long term (current) use of non-steroidal anti-inflammatories (NSAID): Secondary | ICD-10-CM | POA: Diagnosis not present

## 2016-04-11 LAB — URINE MICROSCOPIC-ADD ON

## 2016-04-11 LAB — URINALYSIS, ROUTINE W REFLEX MICROSCOPIC
GLUCOSE, UA: NEGATIVE mg/dL
HGB URINE DIPSTICK: NEGATIVE
KETONES UR: 15 mg/dL — AB
Nitrite: POSITIVE — AB
PH: 5 (ref 5.0–8.0)
PROTEIN: NEGATIVE mg/dL
Specific Gravity, Urine: 1.018 (ref 1.005–1.030)

## 2016-04-11 LAB — POC URINE PREG, ED: Preg Test, Ur: NEGATIVE
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
## 2016-04-12 MED ORDER — IBUPROFEN 800 MG PO TABS
800.0000 mg | ORAL_TABLET | Freq: Once | ORAL | Status: AC
  Administered 2016-04-12: 800 mg via ORAL
  Filled 2016-04-12: qty 1

## 2016-04-12 MED ORDER — NITROFURANTOIN MONOHYD MACRO 100 MG PO CAPS: 100.0000 mg | ORAL_CAPSULE | Freq: Two times a day (BID) | ORAL | Status: DC

## 2016-04-12 MED ORDER — NITROFURANTOIN MONOHYD MACRO 100 MG PO CAPS
100.0000 mg | ORAL_CAPSULE | Freq: Once | ORAL | Status: AC
  Administered 2016-04-12: 100 mg via ORAL
  Filled 2016-04-12: qty 1

## 2016-04-12 MED ORDER — NAPROXEN 500 MG PO TABS: 500.0000 mg | ORAL_TABLET | Freq: Two times a day (BID) | ORAL | Status: DC

## 2016-04-12 MED ORDER — ORPHENADRINE CITRATE ER 100 MG PO TB12: 100.0000 mg | ORAL_TABLET | Freq: Two times a day (BID) | ORAL | Status: DC

## 2016-04-12 NOTE — Discharge Instructions (Signed)
Cervical Sprain °A cervical sprain is an injury in the neck in which the strong, fibrous tissues (ligaments) that connect your neck bones stretch or tear. Cervical sprains can range from mild to severe. Severe cervical sprains can cause the neck vertebrae to be unstable. This can lead to damage of the spinal cord and can result in serious nervous system problems. The amount of time it takes for a cervical sprain to get better depends on the cause and extent of the injury. Most cervical sprains heal in 1 to 3 weeks. °CAUSES  °Severe cervical sprains may be caused by:  °· Contact sport injuries (such as from football, rugby, wrestling, hockey, auto racing, gymnastics, diving, martial arts, or boxing).   °· Motor vehicle collisions.   °· Whiplash injuries. This is an injury from a sudden forward and backward whipping movement of the head and neck.  °· Falls.   °Mild cervical sprains may be caused by:  °· Being in an awkward position, such as while cradling a telephone between your ear and shoulder.   °· Sitting in a chair that does not offer proper support.   °· Working at a poorly designed computer station.   °· Looking up or down for long periods of time.   °SYMPTOMS  °· Pain, soreness, stiffness, or a burning sensation in the front, back, or sides of the neck. This discomfort may develop immediately after the injury or slowly, 24 hours or more after the injury.   °· Pain or tenderness directly in the middle of the back of the neck.   °· Shoulder or upper back pain.   °· Limited ability to move the neck.   °· Headache.   °· Dizziness.   °· Weakness, numbness, or tingling in the hands or arms.   °· Muscle spasms.   °· Difficulty swallowing or chewing.   °· Tenderness and swelling of the neck.   °DIAGNOSIS  °Most of the time your health care provider can diagnose a cervical sprain by taking your history and doing a physical exam. Your health care provider will ask about previous neck injuries and any known neck  problems, such as arthritis in the neck. X-rays may be taken to find out if there are any other problems, such as with the bones of the neck. Other tests, such as a CT scan or MRI, may also be needed.  °TREATMENT  °Treatment depends on the severity of the cervical sprain. Mild sprains can be treated with rest, keeping the neck in place (immobilization), and pain medicines. Severe cervical sprains are immediately immobilized. Further treatment is done to help with pain, muscle spasms, and other symptoms and may include: °· Medicines, such as pain relievers, numbing medicines, or muscle relaxants.   °· Physical therapy. This may involve stretching exercises, strengthening exercises, and posture training. Exercises and improved posture can help stabilize the neck, strengthen muscles, and help stop symptoms from returning.   °HOME CARE INSTRUCTIONS  °· Put ice on the injured area.   °¨ Put ice in a plastic bag.   °¨ Place a towel between your skin and the bag.   °¨ Leave the ice on for 15-20 minutes, 3-4 times a day.   °· If your injury was severe, you may have been given a cervical collar to wear. A cervical collar is a two-piece collar designed to keep your neck from moving while it heals. °¨ Do not remove the collar unless instructed by your health care provider. °¨ If you have long hair, keep it outside of the collar. °¨ Ask your health care provider before making any adjustments to your collar. Minor   adjustments may be required over time to improve comfort and reduce pressure on your chin or on the back of your head.  Ifyou are allowed to remove the collar for cleaning or bathing, follow your health care provider's instructions on how to do so safely.  Keep your collar clean by wiping it with mild soap and water and drying it completely. If the collar you have been given includes removable pads, remove them every 1-2 days and hand wash them with soap and water. Allow them to air dry. They should be completely  dry before you wear them in the collar.  If you are allowed to remove the collar for cleaning and bathing, wash and dry the skin of your neck. Check your skin for irritation or sores. If you see any, tell your health care provider.  Do not drive while wearing the collar.   Only take over-the-counter or prescription medicines for pain, discomfort, or fever as directed by your health care provider.   Keep all follow-up appointments as directed by your health care provider.   Keep all physical therapy appointments as directed by your health care provider.   Make any needed adjustments to your workstation to promote good posture.   Avoid positions and activities that make your symptoms worse.   Warm up and stretch before being active to help prevent problems.  SEEK MEDICAL CARE IF:   Your pain is not controlled with medicine.   You are unable to decrease your pain medicine over time as planned.   Your activity level is not improving as expected.  SEEK IMMEDIATE MEDICAL CARE IF:   You develop any bleeding.  You develop stomach upset.  You have signs of an allergic reaction to your medicine.   Your symptoms get worse.   You develop new, unexplained symptoms.   You have numbness, tingling, weakness, or paralysis in any part of your body.  MAKE SURE YOU:   Understand these instructions.  Will watch your condition.  Will get help right away if you are not doing well or get worse.   This information is not intended to replace advice given to you by your health care provider. Make sure you discuss any questions you have with your health care provider.   Document Released: 09/09/2007 Document Revised: 11/17/2013 Document Reviewed: 05/20/2013 Elsevier Interactive Patient Education 2016 [employer]  Urinary Tract Infection Urinary tract infections (UTIs) can develop anywhere along your urinary tract. Your urinary tract is your body's drainage system for removing  wastes and extra water. Your urinary tract includes two kidneys, two ureters, a bladder, and a urethra. Your kidneys are a pair of bean-shaped organs. Each kidney is about the size of your fist. They are located below your ribs, one on each side of your spine. CAUSES Infections are caused by microbes, which are microscopic organisms, including fungi, viruses, and bacteria. These organisms are so small that they can only be seen through a microscope. Bacteria are the microbes that most commonly cause UTIs. SYMPTOMS  Symptoms of UTIs may vary by age and gender of the patient and by the location of the infection. Symptoms in young women typically include a frequent and intense urge to urinate and a painful, burning feeling in the bladder or urethra during urination. Older women and men are more likely to be tired, shaky, and weak and have muscle aches and abdominal pain. A fever may mean the infection is in your kidneys. Other symptoms of a kidney infection include pain  in your back or sides below the ribs, nausea, and vomiting. DIAGNOSIS To diagnose a UTI, your caregiver will ask you about your symptoms. Your caregiver will also ask you to provide a urine sample. The urine sample will be tested for bacteria and white blood cells. White blood cells are made by your body to help fight infection. TREATMENT  Typically, UTIs can be treated with medication. Because most UTIs are caused by a bacterial infection, they usually can be treated with the use of antibiotics. The choice of antibiotic and length of treatment depend on your symptoms and the type of bacteria causing your infection. HOME CARE INSTRUCTIONS  If you were prescribed antibiotics, take them exactly as your caregiver instructs you. Finish the medication even if you feel better after you have only taken some of the medication.  Drink enough water and fluids to keep your urine clear or pale yellow.  Avoid caffeine, tea, and carbonated beverages.  They tend to irritate your bladder.  Empty your bladder often. Avoid holding urine for long periods of time.  Empty your bladder before and after sexual intercourse.  After a bowel movement, women should cleanse from front to back. Use each tissue only once. SEEK MEDICAL CARE IF:   You have back pain.  You develop a fever.  Your symptoms do not begin to resolve within 3 days. SEEK IMMEDIATE MEDICAL CARE IF:   You have severe back pain or lower abdominal pain.  You develop chills.  You have nausea or vomiting.  You have continued burning or discomfort with urination. MAKE SURE YOU:   Understand these instructions.  Will watch your condition.  Will get help right away if you are not doing well or get worse.   This information is not intended to replace advice given to you by your health care provider. Make sure you discuss any questions you have with your health care provider.   Document Released: 08/22/2005 Document Revised: 08/03/2015 Document Reviewed: 12/21/2011 Elsevier Interactive Patient Education 2016 [employer]  Naproxen and naproxen sodium oral immediate-release tablets What is this medicine? NAPROXEN (na PROX en) is a non-steroidal anti-inflammatory drug (NSAID). It is used to reduce swelling and to treat pain. This medicine may be used for dental pain, headache, or painful monthly periods. It is also used for painful joint and muscular problems such as arthritis, tendinitis, bursitis, and gout. This medicine may be used for other purposes; ask your health care provider or pharmacist if you have questions. What should I tell my health care provider before I take this medicine? They need to know if you have any of these conditions: -asthma -cigarette smoker -drink more than 3 alcohol containing drinks a day -heart disease or circulation problems such as heart failure or leg edema (fluid retention) -high blood pressure -kidney disease -liver  disease -stomach bleeding or ulcers -an unusual or allergic reaction to naproxen, aspirin, other NSAIDs, other medicines, foods, dyes, or preservatives -pregnant or trying to get pregnant -breast-feeding How should I use this medicine? Take this medicine by mouth with a glass of water. Follow the directions on the prescription label. Take it with food if your stomach gets upset. Try to not lie down for at least 10 minutes after you take it. Take your medicine at regular intervals. Do not take your medicine more often than directed. Long-term, continuous use may increase the risk of heart attack or stroke. A special MedGuide will be given to you by the pharmacist with each prescription and  refill. Be sure to read this information carefully each time. Talk to your pediatrician regarding the use of this medicine in children. Special care may be needed. Overdosage: If you think you have taken too much of this medicine contact a poison control center or emergency room at once. NOTE: This medicine is only for you. Do not share this medicine with others. What if I miss a dose? If you miss a dose, take it as soon as you can. If it is almost time for your next dose, take only that dose. Do not take double or extra doses. What may interact with this medicine? -alcohol -aspirin -cidofovir -diuretics -lithium -methotrexate -other drugs for inflammation like ketorolac or prednisone -pemetrexed -probenecid -warfarin This list may not describe all possible interactions. Give your health care provider a list of all the medicines, herbs, non-prescription drugs, or dietary supplements you use. Also tell them if you smoke, drink alcohol, or use illegal drugs. Some items may interact with your medicine. What should I watch for while using this medicine? Tell your doctor or health care professional if your pain does not get better. Talk to your doctor before taking another medicine for pain. Do not treat  yourself. This medicine does not prevent heart attack or stroke. In fact, this medicine may increase the chance of a heart attack or stroke. The chance may increase with longer use of this medicine and in people who have heart disease. If you take aspirin to prevent heart attack or stroke, talk with your doctor or health care professional. Do not take other medicines that contain aspirin, ibuprofen, or naproxen with this medicine. Side effects such as stomach upset, nausea, or ulcers may be more likely to occur. Many medicines available without a prescription should not be taken with this medicine. This medicine can cause ulcers and bleeding in the stomach and intestines at any time during treatment. Do not smoke cigarettes or drink alcohol. These increase irritation to your stomach and can make it more susceptible to damage from this medicine. Ulcers and bleeding can happen without warning symptoms and can cause death. You may get drowsy or dizzy. Do not drive, use machinery, or do anything that needs mental alertness until you know how this medicine affects you. Do not stand or sit up quickly, especially if you are an older patient. This reduces the risk of dizzy or fainting spells. This medicine can cause you to bleed more easily. Try to avoid damage to your teeth and gums when you brush or floss your teeth. What side effects may I notice from receiving this medicine? Side effects that you should report to your doctor or health care professional as soon as possible: -black or bloody stools, blood in the urine or vomit -blurred vision -chest pain -difficulty breathing or wheezing -nausea or vomiting -severe stomach pain -skin rash, skin redness, blistering or peeling skin, hives, or itching -slurred speech or weakness on one side of the body -swelling of eyelids, throat, lips -unexplained weight gain or swelling -unusually weak or tired -yellowing of eyes or skin Side effects that usually do  not require medical attention (report to your doctor or health care professional if they continue or are bothersome): -constipation -headache -heartburn This list may not describe all possible side effects. Call your doctor for medical advice about side effects. You may report side effects to FDA at 1-800-FDA-1088. Where should I keep my medicine? Keep out of the reach of children. Store at room temperature between 15 and  30 degrees C (59 and 86 degrees F). Keep container tightly closed. Throw away any unused medicine after the expiration date. NOTE: This sheet is a summary. It may not cover all possible information. If you have questions about this medicine, talk to your doctor, pharmacist, or health care provider.    2016, Elsevier/Gold Standard. (2009-11-14 20:10:16)  Orphenadrine tablets What is this medicine? ORPHENADRINE (or FEN a dreen) helps to relieve pain and stiffness in muscles and can treat muscle spasms. This medicine may be used for other purposes; ask your health care provider or pharmacist if you have questions. What should I tell my health care provider before I take this medicine? They need to know if you have any of these conditions: -glaucoma -heart disease -kidney disease -myasthenia gravis -peptic ulcer disease -prostate disease -stomach problems -an unusual or allergic reaction to orphenadrine, other medicines, foods, lactose, dyes, or preservatives -pregnant or trying to get pregnant -breast-feeding How should I use this medicine? Take this medicine by mouth with a full glass of water. Follow the directions on the prescription label. Take your medicine at regular intervals. Do not take your medicine more often than directed. Do not take more than you are told to take. Talk to your pediatrician regarding the use of this medicine in children. Special care may be needed. Patients over 24 years old may have a stronger reaction and need a smaller dose. Overdosage:  If you think you have taken too much of this medicine contact a poison control center or emergency room at once. NOTE: This medicine is only for you. Do not share this medicine with others. What if I miss a dose? If you miss a dose, take it as soon as you can. If it is almost time for your next dose, take only that dose. Do not take double or extra doses. What may interact with this medicine? -alcohol -antihistamines -barbiturates, like phenobarbital -benzodiazepines -cyclobenzaprine -medicines for pain -phenothiazines like chlorpromazine, mesoridazine, prochlorperazine, thioridazine This list may not describe all possible interactions. Give your health care provider a list of all the medicines, herbs, non-prescription drugs, or dietary supplements you use. Also tell them if you smoke, drink alcohol, or use illegal drugs. Some items may interact with your medicine. What should I watch for while using this medicine? Your mouth may get dry. Chewing sugarless gum or sucking hard candy, and drinking plenty of water may help. Contact your doctor if the problem does not go away or is severe. This medicine may cause dry eyes and blurred vision. If you wear contact lenses you may feel some discomfort. Lubricating drops may help. See your eye doctor if the problem does not go away or is severe. You may get drowsy or dizzy. Do not drive, use machinery, or do anything that needs mental alertness until you know how this medicine affects you. Do not stand or sit up quickly, especially if you are an older patient. This reduces the risk of dizzy or fainting spells. Alcohol may interfere with the effect of this medicine. Avoid alcoholic drinks. What side effects may I notice from receiving this medicine? Side effects that you should report to your doctor or health care professional as soon as possible: -allergic reactions like skin rash, itching or hives, swelling of the face, lips, or tongue -changes in  vision -difficulty breathing -fast heartbeat or palpitations -hallucinations -light headedness, fainting spells -vomiting Side effects that usually do not require medical attention (report to your doctor or health care professional if  they continue or are bothersome): -dizziness -drowsiness -headache -nausea This list may not describe all possible side effects. Call your doctor for medical advice about side effects. You may report side effects to FDA at 1-800-FDA-1088. Where should I keep my medicine? Keep out of the reach of children. This medicine may cause accidental overdose and death if it taken by other adults, children, or pets. Mix any unused medicine with a substance like cat litter or coffee grounds. Then throw the medicine away in a sealed container like a sealed bag or a coffee can with a lid. Do not use the medicine after the expiration date. Store at room temperature between 15 and 30 degrees C (59 and 86 degrees F). NOTE: This sheet is a summary. It may not cover all possible information. If you have questions about this medicine, talk to your doctor, pharmacist, or health care provider.    2016, Else[employer] Standard. (2014-01-08 15:35:08)  Nitrofurantoin tablets or capsules What is this medicine? NITROFURANTOIN (nye troe fyoor AN toyn) is an antibiotic. It is used to treat urinary tract infections. This medicine may be used for other purposes; ask your health care provider or pharmacist if you have questions. What should I tell my health care provider before I take this medicine? They need to know if you have any of these conditions: -anemia -diabetes -glucose-6-phosphate dehydrogenase deficiency -kidney disease -liver disease -lung disease -other chronic illness -an unusual or allergic reaction to nitrofurantoin, other antibiotics, other medicines, foods, dyes or preservatives -pregnant or trying to get pregnant -breast-feeding How should I use this  medicine? Take this medicine by mouth with a glass of water. Follow the directions on the prescription label. Take this medicine with food or milk. Take your doses at regular intervals. Do not take your medicine more often than directed. Do not stop taking except on your doctor's advice. Talk to your pediatrician regarding the use of this medicine in children. While this drug may be prescribed for selected conditions, precautions do apply. Overdosage: If you think you have taken too much of this medicine contact a poison control center or emergency room at once. NOTE: This medicine is only for you. Do not share this medicine with others. What if I miss a dose? If you miss a dose, take it as soon as you can. If it is almost time for your next dose, take only that dose. Do not take double or extra doses. What may interact with this medicine? -antacids containing magnesium trisilicate -probenecid -quinolone antibiotics like ciprofloxacin, lomefloxacin, norfloxacin and ofloxacin -sulfinpyrazone This list may not describe all possible interactions. Give your health care provider a list of all the medicines, herbs, non-prescription drugs, or dietary supplements you use. Also tell them if you smoke, drink alcohol, or use illegal drugs. Some items may interact with your medicine. What should I watch for while using this medicine? Tell your doctor or health care professional if your symptoms do not improve or if you get new symptoms. Drink several glasses of water a day. If you are taking this medicine for a long time, visit your doctor for regular checks on your progress. If you are diabetic, you may get a false positive result for sugar in your urine with certain brands of urine tests. Check with your doctor. What side effects may I notice from receiving this medicine? Side effects that you should report to your doctor or health care professional as soon as possible: -allergic reactions like skin rash or  hives, swelling  of the face, lips, or tongue -chest pain -cough -difficulty breathing -dizziness, drowsiness -fever or infection -joint aches or pains -pale or blue-tinted skin -redness, blistering, peeling or loosening of the skin, including inside the mouth -tingling, burning, pain, or numbness in hands or feet -unusual bleeding or bruising -unusually weak or tired -yellowing of eyes or skin Side effects that usually do not require medical attention (report to your doctor or health care professional if they continue or are bothersome): -dark urine -diarrhea -headache -loss of appetite -nausea or vomiting -temporary hair loss This list may not describe all possible side effects. Call your doctor for medical advice about side effects. You may report side effects to FDA at 1-800-FDA-1088. Where should I keep my medicine? Keep out of the reach of children. Store at room temperature between 15 and 30 degrees C (59 and 86 degrees F). Protect from light. Throw away any unused medicine after the expiration date. NOTE: This sheet is a summary. It may not cover all possible information. If you have questions about this medicine, talk to your doctor, pharmacist, or health care provider.    2016, [employer]vier/[employer] Standard. (2008-06-02 15:56:47)

## 2016-04-12 NOTE — ED Notes (Signed)
MD at bedside. 
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
## 2016-04-13 LAB — URINE CULTURE

## 2016-09-18 ENCOUNTER — Emergency Department
Admission: EM | Admit: 2016-09-18 | Discharge: 2016-09-19 | Disposition: A | Discharge status: Home / Self Care | Payer: Medicaid Other | Attending: Emergency Medicine | Admitting: Emergency Medicine

## 2016-09-18 ENCOUNTER — Encounter: Payer: Self-pay | Admitting: Emergency Medicine

## 2016-09-18 DIAGNOSIS — F17210 Nicotine dependence, cigarettes, uncomplicated: Secondary | ICD-10-CM | POA: Insufficient documentation

## 2016-09-18 DIAGNOSIS — N342 Other urethritis: Secondary | ICD-10-CM

## 2016-09-18 DIAGNOSIS — Z5181 Encounter for therapeutic drug level monitoring: Secondary | ICD-10-CM | POA: Insufficient documentation

## 2016-09-18 DIAGNOSIS — N341 Nonspecific urethritis: Secondary | ICD-10-CM | POA: Insufficient documentation

## 2016-09-18 DIAGNOSIS — R112 Nausea with vomiting, unspecified: Secondary | ICD-10-CM

## 2016-09-18 LAB — CBC
HCT: 42 % (ref 35.0–47.0)
HEMOGLOBIN: 14.4 g/dL (ref 12.0–16.0)
MCH: 30.2 pg (ref 26.0–34.0)
MCHC: 34.4 g/dL (ref 32.0–36.0)
MCV: 87.9 fL (ref 80.0–100.0)
PLATELETS: 243 10*3/uL (ref 150–440)
RBC: 4.78 MIL/uL (ref 3.80–5.20)
RDW: 13.2 % (ref 11.5–14.5)
WBC: 13 10*3/uL — AB (ref 3.6–11.0)

## 2016-09-18 LAB — URINALYSIS COMPLETE WITH MICROSCOPIC (ARMC ONLY)
BACTERIA UA: NONE SEEN
Bilirubin Urine: NEGATIVE
Glucose, UA: NEGATIVE mg/dL
Nitrite: NEGATIVE
PROTEIN: 100 mg/dL — AB
Specific Gravity, Urine: 1.028 (ref 1.005–1.030)
pH: 6 (ref 5.0–8.0)

## 2016-09-18 LAB — COMPREHENSIVE METABOLIC PANEL
ALBUMIN: 5.1 g/dL — AB (ref 3.5–5.0)
ALK PHOS: 54 U/L (ref 38–126)
ALT: 11 U/L — AB (ref 14–54)
AST: 34 U/L (ref 15–41)
Anion gap: 10 (ref 5–15)
BUN: 10 mg/dL (ref 6–20)
CHLORIDE: 106 mmol/L (ref 101–111)
CO2: 20 mmol/L — AB (ref 22–32)
Calcium: 9.7 mg/dL (ref 8.9–10.3)
Creatinine, Ser: 0.74 mg/dL (ref 0.44–1.00)
GFR calc Af Amer: >60 mL/min (ref >60)
GFR calc non Af Amer: >60 mL/min (ref >60)
GLUCOSE: 125 mg/dL — AB (ref 65–99)
Potassium: 3.8 mmol/L (ref 3.5–5.1)
SODIUM: 136 mmol/L (ref 135–145)
Total Bilirubin: 0.4 mg/dL (ref 0.3–1.2)
Total Protein: 8.6 g/dL — ABNORMAL HIGH (ref 6.5–8.1)

## 2016-09-18 LAB — URINE DRUG SCREEN, QUALITATIVE (ARMC ONLY)
AMPHETAMINES, UR SCREEN: NOT DETECTED
Barbiturates, Ur Screen: NOT DETECTED
Benzodiazepine, Ur Scrn: NOT DETECTED
Cannabinoid 50 Ng, Ur ~~LOC~~: POSITIVE — AB
Cocaine Metabolite,Ur ~~LOC~~: NOT DETECTED
MDMA (ECSTASY) UR SCREEN: NOT DETECTED
Methadone Scn, Ur: POSITIVE — AB
Opiate, Ur Screen: NOT DETECTED
Phencyclidine (PCP) Ur S: NOT DETECTED
TRICYCLIC, UR SCREEN: NOT DETECTED

## 2016-09-18 LAB — POCT PREGNANCY, URINE: PREG TEST UR: NEGATIVE

## 2016-09-18 LAB — LIPASE, BLOOD: Lipase: 17 U/L (ref 11–51)

## 2016-09-18 MED ORDER — PROMETHAZINE HCL 25 MG PO TABS: 25.0000 mg | ORAL_TABLET | Freq: Four times a day (QID) | ORAL | 0 refills | Status: AC | PRN

## 2016-09-18 MED ORDER — CEPHALEXIN 500 MG PO CAPS: 500.0000 mg | ORAL_CAPSULE | Freq: Three times a day (TID) | ORAL | 0 refills | Status: AC

## 2016-09-18 MED ORDER — PROMETHAZINE HCL 25 MG/ML IJ SOLN
25.0000 mg | Freq: Once | INTRAMUSCULAR | Status: AC
  Administered 2016-09-18: 25 mg via INTRAVENOUS
  Filled 2016-09-18: qty 1

## 2016-09-18 MED ORDER — DEXTROSE 5 % IV SOLN: 1.0000 g | Freq: Once | INTRAVENOUS | Status: DC

## 2016-09-18 MED ORDER — HALOPERIDOL LACTATE 5 MG/ML IJ SOLN
2.0000 mg | Freq: Once | INTRAMUSCULAR | Status: AC
  Administered 2016-09-18: 2 mg via INTRAVENOUS
  Filled 2016-09-18: qty 1

## 2016-09-18 MED ORDER — SODIUM CHLORIDE 0.9 % IV BOLUS (SEPSIS)
1000.0000 mL | Freq: Once | INTRAVENOUS | Status: AC
  Administered 2016-09-18: 1000 mL via INTRAVENOUS

## 2016-09-18 MED ORDER — ONDANSETRON 4 MG PO TBDP
ORAL_TABLET | ORAL | Status: AC
  Administered 2016-09-18: 4 mg
  Filled 2016-09-18: qty 1

## 2016-09-18 MED ORDER — CEFTRIAXONE SODIUM-DEXTROSE 1-3.74 GM-% IV SOLR
1.0000 g | Freq: Once | INTRAVENOUS | Status: AC
  Administered 2016-09-18: 1 g via INTRAVENOUS
  Filled 2016-09-18: qty 50

## 2016-09-18 NOTE — ED Triage Notes (Signed)
Pt in with nausea and vomiting since today with co epigastric pain. Pt ran out of methadone 2 days ago and  has been out of it for a few months due to hx of heroin abuse. Has appointment in the am to get refill.

## 2016-09-18 NOTE — ED Triage Notes (Signed)
Patient to ER for c/o N/V since early this am with green/yellow emesis. Patient reports weakness and abdominal pain after vomiting episodes. +Chills. States "It feels like I pulled something from throwing up". Patient also reports that she normally takes Methadone, but has not had any in 2 days because she "doesn't like it".

## 2016-09-18 NOTE — ED Provider Notes (Signed)
[facility]-EMERGENCY DEPARTMENT Provider Note   CSN: [id number] Arrival date & time: 09/18/16  2129     History   Chief Complaint Chief Complaint  Patient presents with  . Nausea  . Emesis    HPI Marissa Calderon is a 25 y.o. female history of heroin abuse here presenting with nausea, abdominal pain. Patient states that she's been having epigastric pain since yesterday. She has used heroin previously but not for the last several months. She is on methadone at baseline. Patient states that she is out of methadone for 2 days and started vomiting yesterday. He says she vomited up some greenish yellowish vomit. Denies any fevers but has some chills. Patient states that she Has some dysuria as well.    The history is provided by the patient.    Past Medical History:  Diagnosis Date  . Anemia   . Anxiety   . PTSD     There are no active problems to display for this patient.   Past Surgical History:  Procedure Laterality Date  . I&D EXTREMITY Left 04/08/2015   Procedure: IRRIGATION AND DEBRIDEMENT LEFT HAND;  Surgeon: Betha Loa, MD;  Location: [facility] OR;  Service: Orthopedics;  Laterality: Left;  . THERAPEUTIC ABORTION  june 2013  . WISDOM TOOTH EXTRACTION  2012    OB History    Gravida Para Term Preterm AB Living   2       1 0   SAB TAB Ectopic Multiple Live Births     1             Home Medications    Prior to Admission medications   Medication Sig Start Date End Date Taking? Authorizing Provider  hydrOXYzine (ATARAX/VISTARIL) 25 MG tablet Take 1 tablet (25 mg total) by mouth every 6 (six) hours as needed for anxiety. 02/18/16   Kristen N Ward, DO  naproxen (NAPROSYN) 500 MG tablet Take 1 tablet (500 mg total) by mouth 2 (two) times daily. 04/12/16   [name], MD  nitrofurantoin, macrocrystal-monohydrate, (MACROBID) 100 MG capsule Take 1 capsule (100 mg total) by mouth 2 (two) times daily. 04/12/16   [name], MD  orphenadrine (NORFLEX) 100 MG tablet Take 1 tablet  (100 mg total) by mouth 2 (two) times daily. 04/12/16   [name], MD    Family History Family History  Problem Relation Age of Onset  . Kidney Stones Mother   . Hypertension Father     Social History Social History  Substance Use Topics  . Smoking status: Current Every Day Smoker    Packs/day: 0.50    Types: Cigarettes  . Smokeless tobacco: Not on file  . Alcohol use No     Allergies   Review of patient's allergies indicates no known allergies.   Review of Systems Review of Systems  Gastrointestinal: Positive for abdominal pain and vomiting.  All other systems reviewed and are negative.    Physical Exam Updated Vital Signs BP (!) 105/55   Pulse 76   Temp 98.3 F (36.8 C) (Oral)   Resp (!) 23   Ht 5\' 5"  (1.651 m)   Wt 126 lb (57.2 kg)   LMP 09/11/2016   SpO2 100%   BMI 20.97 kg/m   Physical Exam  Constitutional: She is oriented to person, place, and time.  Uncomfortable, vomiting   HENT:  Head: Normocephalic.  MM dry   Eyes: EOM are normal. Pupils are equal, round, and reactive to light.  Neck: Normal range of motion.  Neck supple.  Cardiovascular: Normal rate, regular rhythm and normal heart sounds.   Pulmonary/Chest: Effort normal and breath sounds normal. No respiratory distress. She has no wheezes.  Abdominal: Soft. Bowel sounds are normal.  + epigastric tenderness, no rebound.   Musculoskeletal: Normal range of motion.  Neurological: She is alert and oriented to person, place, and time.  Skin: Skin is warm.  Nursing note and vitals reviewed.    ED Treatments / Results  Labs (all labs ordered are listed, but only abnormal results are displayed) Labs Reviewed  CBC - Abnormal; Notable for the following:       Result Value   WBC 13.0 (*)    All other components within normal limits  COMPREHENSIVE METABOLIC PANEL - Abnormal; Notable for the following:    CO2 20 (*)    Glucose, Bld 125 (*)    Total Protein 8.6 (*)    Albumin 5.1 (*)    ALT  11 (*)    All other components within normal limits  URINALYSIS COMPLETEWITH MICROSCOPIC ([facility] ONLY) - Abnormal; Notable for the following:    Color, Urine YELLOW (*)    APPearance HAZY (*)    Ketones, ur 2+ (*)    Hgb urine dipstick 3+ (*)    Protein, ur 100 (*)    Leukocytes, UA 2+ (*)    Squamous Epithelial / LPF 6-30 (*)    All other components within normal limits  URINE DRUG SCREEN, QUALITATIVE ([facility] ONLY) - Abnormal; Notable for the following:    Cannabinoid 50 Ng, Ur [place] POSITIVE (*)    Methadone Scn, Ur POSITIVE (*)    All other components within normal limits  URINE CULTURE  LIPASE, BLOOD  POC URINE PREG, ED  POCT PREGNANCY, URINE    EKG  EKG Interpretation None       Radiology No results found.  Procedures Procedures (including critical care time)  Medications Ordered in ED Medications  cefTRIAXone (ROCEPHIN) IVPB 1 g (1 g Intravenous Given 09/18/16 2301)  ondansetron (ZOFRAN-ODT) 4 MG disintegrating tablet (4 mg  Given 09/18/16 2150)  sodium chloride 0.9 % bolus 1,000 mL (0 mLs Intravenous Stopped 09/18/16 2300)  promethazine (PHENERGAN) injection 25 mg (25 mg Intravenous Given 09/18/16 2221)  haloperidol lactate (HALDOL) injection 2 mg (2 mg Intravenous Given 09/18/16 2221)  sodium chloride 0.9 % bolus 1,000 mL (1,000 mLs Intravenous New Bag/Given 09/18/16 2301)     Initial Impression / Assessment and Plan / ED Course  I have reviewed the triage vital signs and the nursing notes.  Pertinent labs & imaging results that were available during my care of the patient were reviewed by me and considered in my medical decision making (see chart for details).  Clinical Course    Marissa Calderon is a 25 y.o. female here with epigastric pain, vomiting. Consider gastro vs methadone withdrawal. Will hydrate patient and give nausea meds and IVF and reassess.   11:14 PM Pain improved. UDS + marijuana. Tolerated PO. UA ? UTI since she is symptomatic, given  rocephin and will dc home with keflex, phenergan. She can get methadone in the clinic tomorrow.   Final Clinical Impressions(s) / ED Diagnoses   Final diagnoses:  None    New Prescriptions New Prescriptions   No medications on file     Charlynne Panderavid Hsienta Raymir Frommelt, MD 09/18/16 2317

## 2016-09-18 NOTE — Discharge Instructions (Signed)
Stay hydrated.   Take phenergan for nausea.   Take keflex three times daily for 7 days.   Take your methadone at the clinic tomorrow.   See your doctor  Return to ER if you have severe pain, vomiting, fevers.

## 2016-09-19 NOTE — ED Notes (Signed)
Discharge instructions reviewed with patient. Patient verbalized understanding. Patient ambulated to lobby without difficulty.   

## 2016-09-20 LAB — URINE CULTURE: CULTURE: NO GROWTH

## 2016-11-29 ENCOUNTER — Emergency Department
Admission: EM | Admit: 2016-11-29 | Discharge: 2016-11-29 | Disposition: A | Discharge status: Home / Self Care | Payer: Self-pay | Attending: Emergency Medicine | Admitting: Emergency Medicine

## 2016-11-29 ENCOUNTER — Encounter: Payer: Self-pay | Admitting: Intensive Care

## 2016-11-29 DIAGNOSIS — R112 Nausea with vomiting, unspecified: Secondary | ICD-10-CM

## 2016-11-29 DIAGNOSIS — Z79899 Other long term (current) drug therapy: Secondary | ICD-10-CM | POA: Insufficient documentation

## 2016-11-29 DIAGNOSIS — F17210 Nicotine dependence, cigarettes, uncomplicated: Secondary | ICD-10-CM | POA: Insufficient documentation

## 2016-11-29 DIAGNOSIS — N390 Urinary tract infection, site not specified: Secondary | ICD-10-CM | POA: Insufficient documentation

## 2016-11-29 LAB — URINALYSIS, ROUTINE W REFLEX MICROSCOPIC
BACTERIA UA: NONE SEEN
Bilirubin Urine: NEGATIVE
Glucose, UA: NEGATIVE mg/dL
Hgb urine dipstick: NEGATIVE
Ketones, ur: 20 mg/dL — AB
Nitrite: NEGATIVE
PROTEIN: 100 mg/dL — AB
Specific Gravity, Urine: 1.024 (ref 1.005–1.030)
pH: 6 (ref 5.0–8.0)

## 2016-11-29 LAB — COMPREHENSIVE METABOLIC PANEL
ALBUMIN: 4.6 g/dL (ref 3.5–5.0)
ALT: 7 U/L — ABNORMAL LOW (ref 14–54)
AST: 19 U/L (ref 15–41)
Alkaline Phosphatase: 49 U/L (ref 38–126)
Anion gap: 7 (ref 5–15)
BUN: 8 mg/dL (ref 6–20)
CHLORIDE: 108 mmol/L (ref 101–111)
CO2: 23 mmol/L (ref 22–32)
Calcium: 9.3 mg/dL (ref 8.9–10.3)
Creatinine, Ser: 0.67 mg/dL (ref 0.44–1.00)
GFR calc Af Amer: >60 mL/min (ref >60)
Glucose, Bld: 105 mg/dL — ABNORMAL HIGH (ref 65–99)
POTASSIUM: 4.2 mmol/L (ref 3.5–5.1)
SODIUM: 138 mmol/L (ref 135–145)
Total Bilirubin: 0.1 mg/dL — ABNORMAL LOW (ref 0.3–1.2)
Total Protein: 8 g/dL (ref 6.5–8.1)

## 2016-11-29 LAB — CBC
HEMATOCRIT: 41.6 % (ref 35.0–47.0)
Hemoglobin: 14.2 g/dL (ref 12.0–16.0)
MCH: 30.5 pg (ref 26.0–34.0)
MCHC: 34.1 g/dL (ref 32.0–36.0)
MCV: 89.4 fL (ref 80.0–100.0)
Platelets: 259 10*3/uL (ref 150–440)
RBC: 4.65 MIL/uL (ref 3.80–5.20)
RDW: 13.5 % (ref 11.5–14.5)
WBC: 11.5 10*3/uL — ABNORMAL HIGH (ref 3.6–11.0)

## 2016-11-29 LAB — LIPASE, BLOOD: LIPASE: 19 U/L (ref 11–51)

## 2016-11-29 LAB — POC URINE PREG, ED: Preg Test, Ur: NEGATIVE

## 2016-11-29 LAB — TSH: TSH: 0.394 u[IU]/mL (ref 0.350–4.500)

## 2016-11-29 LAB — POCT PREGNANCY, URINE: Preg Test, Ur: NEGATIVE

## 2016-11-29 MED ORDER — SODIUM CHLORIDE 0.9 % IV BOLUS (SEPSIS)
1000.0000 mL | Freq: Once | INTRAVENOUS | Status: AC
  Administered 2016-11-29: 1000 mL via INTRAVENOUS

## 2016-11-29 MED ORDER — ONDANSETRON HCL 4 MG/2ML IJ SOLN
4.0000 mg | Freq: Once | INTRAMUSCULAR | Status: AC
  Administered 2016-11-29: 4 mg via INTRAVENOUS

## 2016-11-29 MED ORDER — CEPHALEXIN 500 MG PO CAPS
500.0000 mg | ORAL_CAPSULE | Freq: Four times a day (QID) | ORAL | 0 refills | Status: AC
Start: 2016-11-29 — End: 2016-12-09

## 2016-11-29 MED ORDER — CEPHALEXIN 500 MG PO CAPS
ORAL_CAPSULE | ORAL | Status: AC
  Administered 2016-11-29: 500 mg via ORAL
  Filled 2016-11-29: qty 1

## 2016-11-29 MED ORDER — ONDANSETRON HCL 4 MG PO TABS: 4.0000 mg | ORAL_TABLET | Freq: Three times a day (TID) | ORAL | 0 refills | Status: DC | PRN

## 2016-11-29 MED ORDER — ONDANSETRON HCL 4 MG/2ML IJ SOLN
INTRAMUSCULAR | Status: AC
  Administered 2016-11-29: 4 mg via INTRAVENOUS
  Filled 2016-11-29: qty 2

## 2016-11-29 MED ORDER — CEPHALEXIN 500 MG PO CAPS
500.0000 mg | ORAL_CAPSULE | Freq: Once | ORAL | Status: AC
  Administered 2016-11-29: 500 mg via ORAL
  Filled 2016-11-29: qty 1

## 2016-11-29 NOTE — ED Provider Notes (Addendum)
[facility] Emergency Department Provider Note  ____________________________________________   I have reviewed the triage vital signs and the nursing notes.   HISTORY  Chief Complaint Emesis and Abdominal Pain    HPI Marissa Calderon is a 26 y.o. female who has a history of IV drug abuse in the past, on methadone. Did not take her methadone this morning. Has had approximately 10 hours of nausea and vomiting. Denies diarrhea yet although has some cramping. Denies fever or chills. States she is feeling much better after coming to the hospital. Denies vaginal discharge or pelvic pain. Declines offered pelvic exam.   Emesis is nonbloody nonbilious and did have a normal bowel movement yesterday.  Patient does state she has a long history of feeling cold when everyone around her is hot. I asked her if she have ever had a thyroid problem and she said that she has been told that she did, but she never followed up or took any medication. Will order TSH on her although there is no evidence of thyroid storm or other acutely, as a thyroid pathology. Past Medical History:  Diagnosis Date  . Anemia   . Anxiety   . PTSD     There are no active problems to display for this patient.   Past Surgical History:  Procedure Laterality Date  . I&D EXTREMITY Left 04/08/2015   Procedure: IRRIGATION AND DEBRIDEMENT LEFT HAND;  Surgeon: Betha LoaKevin Kuzma, MD;  [facility] OR;  Service: Orthopedics;  Laterality: Left;  . THERAPEUTIC ABORTION  june 2013  . WISDOM TOOTH EXTRACTION  2012    Prior to Admission medications   Medication Sig Start Date End Date Taking? Authorizing Provider  hydrOXYzine (ATARAX/VISTARIL) 25 MG tablet Take 1 tablet (25 mg total) by mouth every 6 (six) hours as needed for anxiety. 02/18/16   Kristen N Ward, DO  naproxen (NAPROSYN) 500 MG tablet Take 1 tablet (500 mg total) by mouth 2 (two) times daily. 04/12/16   Dione Boozeavid Glick, MD  nitrofurantoin,  macrocrystal-monohydrate, (MACROBID) 100 MG capsule Take 1 capsule (100 mg total) by mouth 2 (two) times daily. 04/12/16   Dione Boozeavid Glick, MD  orphenadrine (NORFLEX) 100 MG tablet Take 1 tablet (100 mg total) by mouth 2 (two) times daily. 04/12/16   Dione Boozeavid Glick, MD  promethazine (PHENERGAN) 25 MG tablet Take 1 tablet (25 mg total) by mouth every 6 (six) hours as needed for nausea or vomiting. 09/18/16   Charlynne Panderavid Hsienta Yao, MD    Allergies Patient has no known allergies.  Family History  Problem Relation Age of Onset  . Kidney Stones Mother   . Hypertension Father     Social History Social History  Substance Use Topics  . Smoking status: Current Every Day Smoker    Packs/day: 0.50    Types: Cigarettes  . Smokeless tobacco: Never Used  . Alcohol use No    Review of Systems Constitutional: No fever/chills Eyes: No visual changes. ENT: No sore throat. No stiff neck no neck pain Cardiovascular: Denies chest pain. Respiratory: Denies shortness of breath. Gastrointestinal:   + vomiting.  No diarrhea.  No constipation. Genitourinary: Positive for mild  dysuria. Musculoskeletal: Negative lower extremity swelling Skin: Negative for rash. Neurological: Negative for severe headaches, focal weakness or numbness. 10-point ROS otherwise negative.  ____________________________________________   PHYSICAL EXAM:  VITAL SIGNS: ED Triage Vitals [11/29/16 1207]  Enc Vitals Group     BP 119/63     Pulse Rate 95     Resp  14     Temp 98.7 F (37.1 C)     Temp Source Oral     SpO2 100 %     Weight 125 lb (56.7 kg)     Height 5\' 5"  (1.651 m)     Head Circumference      Peak Flow      Pain Score      Pain Loc      Pain Edu?      Excl. in GC?     Constitutional: Alert and oriented. Well appearing and in no acute distress. Eyes: Conjunctivae are normal. PERRL. EOMI. Head: Atraumatic. Nose: No congestion/rhinnorhea. Mouth/Throat: Mucous membranes are moist.  Oropharynx  non-erythematous. Neck: No stridor.   Nontender with no meningismus Cardiovascular: Normal rate, regular rhythm. Grossly normal heart sounds.  Good peripheral circulation. Respiratory: Normal respiratory effort.  No retractions. Lungs CTAB. Abdominal: Soft and nontender. No distention. No guarding no rebound Back:  There is no focal tenderness or step off.  there is no midline tenderness there are no lesions noted. there is no CVA tenderness GU: Patient declines Musculoskeletal: No lower extremity tenderness, no upper extremity tenderness. No joint effusions, no DVT signs strong distal pulses no edema Neurologic:  Normal speech and language. No gross focal neurologic deficits are appreciated.  Skin:  Skin is warm, dry and intact. No rash noted. Psychiatric: Mood and affect are normal. Speech and behavior are normal.  ____________________________________________   LABS (all labs ordered are listed, but only abnormal results are displayed)  Labs Reviewed  URINALYSIS, ROUTINE W REFLEX MICROSCOPIC - Abnormal; Notable for the following:       Result Value   Color, Urine AMBER (*)    APPearance CLOUDY (*)    Ketones, ur 20 (*)    Protein, ur 100 (*)    Leukocytes, UA LARGE (*)    Squamous Epithelial / LPF 6-30 (*)    All other components within normal limits  COMPREHENSIVE METABOLIC PANEL - Abnormal; Notable for the following:    Glucose, Bld 105 (*)    ALT 7 (*)    Total Bilirubin 0.1 (*)    All other components within normal limits  CBC - Abnormal; Notable for the following:    WBC 11.5 (*)    All other components within normal limits  CHLAMYDIA/NGC RT PCR ([facility] ONLY)  WET PREP, GENITAL  LIPASE, BLOOD  TSH  URINE DRUG SCREEN, QUALITATIVE ([facility] ONLY)  POC URINE PREG, ED  POCT PREGNANCY, URINE   ____________________________________________  EKG  I personally interpreted any EKGs ordered by me or  triage  ____________________________________________  RADIOLOGY  I reviewed any imaging ordered by me or triage that were performed during my shift and, if possible, patient and/or family made aware of any abnormal findings. ____________________________________________   PROCEDURES  Procedure(s) performed: None  Procedures  Critical Care performed: None  ____________________________________________   INITIAL IMPRESSION / ASSESSMENT AND PLAN / ED COURSE  Pertinent labs & imaging results that were available during my care of the patient were reviewed by me and considered in my medical decision making (see chart for details).  Patient here with nausea and vomiting, abdomen completely benign. Did offer her pelvic exam. She declines bridge episodes without pelvic exam, cannot rule out PID. Patient states that she sure that she does not have that and she does not wish a pelvic exam. Obviously this is her choice. She understands the limitations she places upon me by refusing, and she will return if  she feels worse in any way. Does have evidence of UTI however, there is apparently some mild burning at the end of her urination. Urinalysis shows likely UTI. No culture to guide [place] with sensitivity results. We will start her on Keflex. Patient already feeling better requesting ginger ale at this time. Serial abdominal exams benign.  Of note, no evidence of thyroid dysfunction by routine thyroid screening.  Clinical Course    ____________________________________________   FINAL CLINICAL IMPRESSION(S) / ED DIAGNOSES  Final diagnoses:  None      This chart was dictated using voice recognition software.  Despite best efforts to proofread,  errors can occur which can change meaning.      Jeanmarie Plant, MD 11/29/16 1549    Jeanmarie Plant, MD 11/29/16 [phone]

## 2016-11-29 NOTE — ED Triage Notes (Signed)
Patient presents to ER with lower abdominal pain and emesis since 0630 this AM. Patient states "I cannot drink or eat anything without throwing it up" PAtient ambulatory with NAD noted. Denies urinary symptoms but states "It hurts when I wipe" Denies abnormal d/c

## 2016-11-29 NOTE — Discharge Instructions (Signed)
Return to the emergency room if you feel worse in any way. As noted, you would prefer not to have a pelvic exam. This is certainly your choice but it does make it so we cannot evaluate you for possible infection or other problems in that area. If you have increased pain or change her mind please return to the emergency department. If thyroid test is low normal, please follow-up closely as an outpatient.

## 2017-01-03 ENCOUNTER — Encounter: Payer: Self-pay | Admitting: *Deleted

## 2017-01-03 ENCOUNTER — Emergency Department
Admission: EM | Admit: 2017-01-03 | Discharge: 2017-01-03 | Disposition: A | Discharge status: Home / Self Care | Payer: Self-pay | Attending: Emergency Medicine | Admitting: Emergency Medicine

## 2017-01-03 DIAGNOSIS — Z5321 Procedure and treatment not carried out due to patient leaving prior to being seen by health care provider: Secondary | ICD-10-CM | POA: Insufficient documentation

## 2017-01-03 DIAGNOSIS — F17210 Nicotine dependence, cigarettes, uncomplicated: Secondary | ICD-10-CM | POA: Insufficient documentation

## 2017-01-03 DIAGNOSIS — F1120 Opioid dependence, uncomplicated: Secondary | ICD-10-CM | POA: Insufficient documentation

## 2017-01-03 DIAGNOSIS — F1190 Opioid use, unspecified, uncomplicated: Secondary | ICD-10-CM

## 2017-01-03 DIAGNOSIS — R1084 Generalized abdominal pain: Secondary | ICD-10-CM | POA: Insufficient documentation

## 2017-01-03 DIAGNOSIS — R112 Nausea with vomiting, unspecified: Secondary | ICD-10-CM | POA: Insufficient documentation

## 2017-01-03 LAB — COMPREHENSIVE METABOLIC PANEL
ALK PHOS: 50 U/L (ref 38–126)
ALT: 7 U/L — AB (ref 14–54)
AST: 20 U/L (ref 15–41)
Albumin: 5.1 g/dL — ABNORMAL HIGH (ref 3.5–5.0)
Anion gap: 8 (ref 5–15)
BUN: 9 mg/dL (ref 6–20)
CALCIUM: 9.3 mg/dL (ref 8.9–10.3)
CO2: 20 mmol/L — AB (ref 22–32)
CREATININE: 0.71 mg/dL (ref 0.44–1.00)
Chloride: 109 mmol/L (ref 101–111)
GFR calc non Af Amer: >60 mL/min (ref >60)
GLUCOSE: 130 mg/dL — AB (ref 65–99)
Potassium: 4.1 mmol/L (ref 3.5–5.1)
Sodium: 137 mmol/L (ref 135–145)
Total Bilirubin: 0.7 mg/dL (ref 0.3–1.2)
Total Protein: 8 g/dL (ref 6.5–8.1)

## 2017-01-03 LAB — URINALYSIS, COMPLETE (UACMP) WITH MICROSCOPIC
Bacteria, UA: NONE SEEN
Bilirubin Urine: NEGATIVE
GLUCOSE, UA: NEGATIVE mg/dL
Hgb urine dipstick: NEGATIVE
Ketones, ur: 80 mg/dL — AB
Nitrite: NEGATIVE
PH: 6 (ref 5.0–8.0)
Protein, ur: 100 mg/dL — AB
Specific Gravity, Urine: 1.03 (ref 1.005–1.030)

## 2017-01-03 LAB — LIPASE, BLOOD: Lipase: 17 U/L (ref 11–51)

## 2017-01-03 LAB — CBC
HCT: 39.2 % (ref 35.0–47.0)
Hemoglobin: 13.6 g/dL (ref 12.0–16.0)
MCH: 31 pg (ref 26.0–34.0)
MCHC: 34.7 g/dL (ref 32.0–36.0)
MCV: 89.3 fL (ref 80.0–100.0)
PLATELETS: 230 10*3/uL (ref 150–440)
RBC: 4.38 MIL/uL (ref 3.80–5.20)
RDW: 13.8 % (ref 11.5–14.5)
WBC: 10.9 10*3/uL (ref 3.6–11.0)

## 2017-01-03 LAB — PREGNANCY, URINE: Preg Test, Ur: NEGATIVE

## 2017-01-03 MED ORDER — SODIUM CHLORIDE 0.9 % IV BOLUS (SEPSIS): 1000.0000 mL | INTRAVENOUS | Status: DC

## 2017-01-03 NOTE — ED Triage Notes (Signed)
States vomiting since 4 am with abd pain, states she has been weaning off methadone, pt pale in triage, awake and alert in no acute distress

## 2017-01-03 NOTE — ED Notes (Addendum)
Pt ambulatory to BR w/o issue, gait steady, no incr in resp. Pt mentioned wait time, explained to pt triage process and how critical pts needed to be given priority.  Pt agreeable at this time

## 2017-01-03 NOTE — ED Provider Notes (Signed)
Clinical Course as of Jan 03 1534  Thu Jan 03, 2017  1508 I reviewed the patient's prescription history over the last 12 months in the [place] Controlled Substances Database, and she has one prescription listed for Percocet which was prescribed about 10 months ago, but there are no listings for any other medication, including methadone.  [CF]  1510 I went to see the patient immediately after starting my shift and she is not present in the bed.  I will continue to check but it is possible she eloped.  I had also ordered 1 L of normal saline given that she has ketones in her urine but she has not yet received that.  [CF]  1534 Patient still not in her bed.  I am concerned she may have eloped and I do not know if she still had a saline lock.  I will alert nursing staff.  [CF]  1534 Nursing staff verified that she did elope (LWBS by a provider), but they removed her saline lock prior to her departure.  I did not see the patient.  [CF]    Clinical Course User Index [CF] Loleta Roseory Benjiman Sedgwick, MD      Loleta Roseory Yeng Frankie, MD 01/03/17 [phone]

## 2017-01-03 NOTE — ED Notes (Signed)
Pt left ED irate.  Pt stated that she had not been seen, which was "unfair" because "I got here at 10 and some of these people just got here".  Pt also unhappy with hallway bed, sts that every time door opens she is cold.  Explained to pt triage process, that pts were seen based on acuity. Pt sts that she would be going to another hospital with shorter wait.  IV removed by this RN.  Pts gait steady, resp even and unlabored.

## 2017-01-04 ENCOUNTER — Emergency Department (HOSPITAL_COMMUNITY)
Admission: EM | Admit: 2017-01-04 | Discharge: 2017-01-04 | Disposition: A | Discharge status: Home / Self Care | Payer: Self-pay | Attending: Emergency Medicine | Admitting: Emergency Medicine

## 2017-01-04 ENCOUNTER — Encounter (HOSPITAL_COMMUNITY): Payer: Self-pay | Admitting: *Deleted

## 2017-01-04 DIAGNOSIS — F17210 Nicotine dependence, cigarettes, uncomplicated: Secondary | ICD-10-CM | POA: Insufficient documentation

## 2017-01-04 DIAGNOSIS — R112 Nausea with vomiting, unspecified: Secondary | ICD-10-CM | POA: Insufficient documentation

## 2017-01-04 DIAGNOSIS — R197 Diarrhea, unspecified: Secondary | ICD-10-CM | POA: Insufficient documentation

## 2017-01-04 LAB — CBC
HCT: 41.5 % (ref 36.0–46.0)
Hemoglobin: 14.2 g/dL (ref 12.0–15.0)
MCH: 30.3 pg (ref 26.0–34.0)
MCHC: 34.2 g/dL (ref 30.0–36.0)
MCV: 88.5 fL (ref 78.0–100.0)
Platelets: 253 10*3/uL (ref 150–400)
RBC: 4.69 MIL/uL (ref 3.87–5.11)
RDW: 12.9 % (ref 11.5–15.5)
WBC: 8.5 10*3/uL (ref 4.0–10.5)

## 2017-01-04 LAB — COMPREHENSIVE METABOLIC PANEL
ALT: 7 U/L — ABNORMAL LOW (ref 14–54)
AST: 17 U/L (ref 15–41)
Albumin: 5 g/dL (ref 3.5–5.0)
Alkaline Phosphatase: 48 U/L (ref 38–126)
Anion gap: 14 (ref 5–15)
BUN: 9 mg/dL (ref 6–20)
CO2: 20 mmol/L — ABNORMAL LOW (ref 22–32)
Calcium: 10 mg/dL (ref 8.9–10.3)
Chloride: 104 mmol/L (ref 101–111)
Creatinine, Ser: 0.66 mg/dL (ref 0.44–1.00)
GFR calc Af Amer: >60 mL/min (ref >60)
GFR calc non Af Amer: >60 mL/min (ref >60)
Glucose, Bld: 110 mg/dL — ABNORMAL HIGH (ref 65–99)
Potassium: 3.1 mmol/L — ABNORMAL LOW (ref 3.5–5.1)
Sodium: 138 mmol/L (ref 135–145)
Total Bilirubin: 0.8 mg/dL (ref 0.3–1.2)
Total Protein: 7.9 g/dL (ref 6.5–8.1)

## 2017-01-04 LAB — URINALYSIS, ROUTINE W REFLEX MICROSCOPIC
Bilirubin Urine: NEGATIVE
Glucose, UA: NEGATIVE mg/dL
Hgb urine dipstick: NEGATIVE
Ketones, ur: 80 mg/dL — AB
Nitrite: NEGATIVE
Protein, ur: 100 mg/dL — AB
Specific Gravity, Urine: 1.029 (ref 1.005–1.030)
pH: 6 (ref 5.0–8.0)

## 2017-01-04 LAB — I-STAT BETA HCG BLOOD, ED (MC, WL, AP ONLY): I-stat hCG, quantitative: <5 m[IU]/mL (ref <5)

## 2017-01-04 LAB — LIPASE, BLOOD: Lipase: 17 U/L (ref 11–51)

## 2017-01-04 MED ORDER — ONDANSETRON HCL 4 MG/2ML IJ SOLN
4.0000 mg | Freq: Once | INTRAMUSCULAR | Status: AC
  Administered 2017-01-04: 4 mg via INTRAVENOUS
  Filled 2017-01-04: qty 2

## 2017-01-04 MED ORDER — LOPERAMIDE HCL 2 MG PO CAPS: 2.0000 mg | ORAL_CAPSULE | Freq: Four times a day (QID) | ORAL | 0 refills | Status: AC | PRN

## 2017-01-04 MED ORDER — SODIUM CHLORIDE 0.9 % IV BOLUS (SEPSIS): 1000.0000 mL | Freq: Once | INTRAVENOUS | Status: DC

## 2017-01-04 MED ORDER — POTASSIUM CHLORIDE CRYS ER 20 MEQ PO TBCR
40.0000 meq | EXTENDED_RELEASE_TABLET | Freq: Once | ORAL | Status: DC
  Filled 2017-01-04: qty 2

## 2017-01-04 MED ORDER — KETOROLAC TROMETHAMINE 15 MG/ML IJ SOLN
15.0000 mg | Freq: Once | INTRAMUSCULAR | Status: AC
  Administered 2017-01-04: 15 mg via INTRAVENOUS
  Filled 2017-01-04: qty 1

## 2017-01-04 MED ORDER — ONDANSETRON HCL 4 MG PO TABS: 4.0000 mg | ORAL_TABLET | Freq: Four times a day (QID) | ORAL | 0 refills | Status: AC

## 2017-01-04 MED ORDER — SODIUM CHLORIDE 0.9 % IV BOLUS (SEPSIS)
1000.0000 mL | Freq: Once | INTRAVENOUS | Status: AC
  Administered 2017-01-04: 1000 mL via INTRAVENOUS

## 2017-01-04 MED ORDER — DICYCLOMINE HCL 10 MG/ML IM SOLN
10.0000 mg | Freq: Once | INTRAMUSCULAR | Status: AC
  Administered 2017-01-04: 10 mg via INTRAMUSCULAR
  Filled 2017-01-04: qty 2

## 2017-01-04 MED ORDER — CLONIDINE HCL 0.1 MG PO TABS: 0.1000 mg | ORAL_TABLET | Freq: Two times a day (BID) | ORAL | 0 refills | Status: AC | PRN

## 2017-01-04 MED ORDER — DICYCLOMINE HCL 20 MG PO TABS: 20.0000 mg | ORAL_TABLET | Freq: Four times a day (QID) | ORAL | 0 refills | Status: AC | PRN

## 2017-01-04 MED ORDER — ONDANSETRON 4 MG PO TBDP
ORAL_TABLET | ORAL | Status: AC
  Filled 2017-01-04: qty 1

## 2017-01-04 MED ORDER — ONDANSETRON 4 MG PO TBDP
4.0000 mg | ORAL_TABLET | Freq: Once | ORAL | Status: AC
  Administered 2017-01-04: 4 mg via ORAL

## 2017-01-04 NOTE — ED Notes (Signed)
Pt is in stable condition upon d/c and ambulates from ED. 

## 2017-01-04 NOTE — ED Provider Notes (Signed)
[facility]-EMERGENCY DEPT Provider Note   CSN: [id number] Arrival date & time: 01/04/17  0810     History   Chief Complaint Chief Complaint  Patient presents with  . Emesis    HPI Marissa Calderon is a 26 y.o. female.  HPI   25yF with n/v since around 0400 yesterday. Went to [facility] but eloped and she is upset because she did not receive IVF. There is a note from Dr York Cerise that he went to see her as soon as his shift started and ordered a NS bolus, but apparently she left  just before. She has had continued epigastric pain and n/v. No fever or chills. No urinary complaints. Takes methadone but last dose Wednesday.   Past Medical History:  Diagnosis Date  . Anemia   . Anxiety   . PTSD     There are no active problems to display for this patient.   Past Surgical History:  Procedure Laterality Date  . I&D EXTREMITY Left 04/08/2015   Procedure: IRRIGATION AND DEBRIDEMENT LEFT HAND;  Surgeon: Betha Loa, MD;  Location: [facility] OR;  Service: Orthopedics;  Laterality: Left;  . THERAPEUTIC ABORTION  june 2013  . WISDOM TOOTH EXTRACTION  2012    OB History    Gravida Para Term Preterm AB Living   2       1 0   SAB TAB Ectopic Multiple Live Births     1             Home Medications    Prior to Admission medications   Medication Sig Start Date End Date Taking? Authorizing Provider  hydrOXYzine (ATARAX/VISTARIL) 25 MG tablet Take 1 tablet (25 mg total) by mouth every 6 (six) hours as needed for anxiety. 02/18/16   Kristen N Ward, DO  naproxen (NAPROSYN) 500 MG tablet Take 1 tablet (500 mg total) by mouth 2 (two) times daily. 04/12/16   [name], MD  nitrofurantoin, macrocrystal-monohydrate, (MACROBID) 100 MG capsule Take 1 capsule (100 mg total) by mouth 2 (two) times daily. 04/12/16   [name], MD  ondansetron (ZOFRAN) 4 MG tablet Take 1 tablet (4 mg total) by mouth every 8 (eight) hours as needed for nausea or vomiting. 11/29/16   Jeanmarie Plant, MD  orphenadrine (NORFLEX) 100  MG tablet Take 1 tablet (100 mg total) by mouth 2 (two) times daily. 04/12/16   [name], MD  promethazine (PHENERGAN) 25 MG tablet Take 1 tablet (25 mg total) by mouth every 6 (six) hours as needed for nausea or vomiting. 09/18/16   Charlynne Pander, MD    Family History Family History  Problem Relation Age of Onset  . Kidney Stones Mother   . Hypertension Father     Social History Social History  Substance Use Topics  . Smoking status: Current Every Day Smoker    Packs/day: 0.50    Types: Cigarettes  . Smokeless tobacco: Never Used  . Alcohol use No     Allergies   Patient has no known allergies.   Review of Systems Review of Systems  All systems reviewed and negative, other than as noted in HPI.  Physical Exam Updated Vital Signs BP 115/70 (BP Location: Left Arm)   Pulse 79   Temp 97.8 F (36.6 C) (Oral)   Resp 20   Ht 5\' 5"  (1.651 m)   Wt 120 lb (54.4 kg)   LMP 12/25/2016 (Approximate)   SpO2 99%   BMI 19.97 kg/m   Physical Exam  Constitutional: She appears well-developed and well-nourished. No distress.  HENT:  Head: Normocephalic and atraumatic.  Eyes: Conjunctivae are normal. Right eye exhibits no discharge. Left eye exhibits no discharge.  Neck: Neck supple.  Cardiovascular: Normal rate, regular rhythm and normal heart sounds.  Exam reveals no gallop and no friction rub.   No murmur heard. Pulmonary/Chest: Effort normal and breath sounds normal. No respiratory distress.  Abdominal: Soft. She exhibits no distension. There is tenderness.  Very mild epigastric tenderness  Musculoskeletal: She exhibits no edema or tenderness.  Neurological: She is alert.  Skin: Skin is warm and dry.  Psychiatric: She has a normal mood and affect. Her behavior is normal. Thought content normal.  Nursing note and vitals reviewed.    ED Treatments / Results  Labs (all labs ordered are listed, but only abnormal results are displayed) Labs Reviewed  COMPREHENSIVE  METABOLIC PANEL - Abnormal; Notable for the following:       Result Value   Potassium 3.1 (*)    CO2 20 (*)    Glucose, Bld 110 (*)    ALT 7 (*)    All other components within normal limits  URINALYSIS, ROUTINE W REFLEX MICROSCOPIC - Abnormal; Notable for the following:    Color, Urine AMBER (*)    APPearance HAZY (*)    Ketones, ur 80 (*)    Protein, ur 100 (*)    Leukocytes, UA LARGE (*)    Bacteria, UA RARE (*)    Squamous Epithelial / LPF 6-30 (*)    All other components within normal limits  LIPASE, BLOOD  CBC  I-STAT BETA HCG BLOOD, ED ([facility], [facility], [facility] ONLY)    EKG  EKG Interpretation None       Radiology No results found.  Procedures Procedures (including critical care time)  Medications Ordered in ED Medications  sodium chloride 0.9 % bolus 1,000 mL (not administered)  sodium chloride 0.9 % bolus 1,000 mL (not administered)  ondansetron (ZOFRAN) injection 4 mg (not administered)  ondansetron (ZOFRAN-ODT) disintegrating tablet 4 mg (4 mg Oral Given 01/04/17 0855)     Initial Impression / Assessment and Plan / ED Course  I have reviewed the triage vital signs and the nursing notes.  Pertinent labs & imaging results that were available during my care of the patient were reviewed by me and considered in my medical decision making (see chart for details).     25yF with n/v. Possible viral GI illness. Abdominal exam is benign. She is nontoxic. Ketones on UA. Other UA findings noted, but she has no urinary complaints. IVF and antiemetics. May potentially be opiate withdrawal as well. 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   Now feeling better. Doubt emergent process. It has been determined that no acute conditions requiring further emergency intervention are present at this time. The patient has been advised of the diagnosis and plan. I reviewed any labs and imaging including any potential incidental findings. We have  discussed signs and symptoms that warrant return to the ED and they are listed in the discharge instructions.    Final Clinical Impressions(s) / ED Diagnoses   Final diagnoses:  Nausea vomiting and diarrhea    New Prescriptions New Prescriptions   No medications on file     Raeford RazorStephen Derral Colucci, MD 01/16/17 [phone]

## 2017-01-04 NOTE — ED Triage Notes (Signed)
Pt to ED for emesis since yesterday and epigastric pain since starting to vomit.  States was placed in hallway at [facility] and had IV placed, but was never given any fluids and "can't understand why they couldn't have just given a bag of fluids".  Pt does take prescribed methadone, but has not been able to keep it down since Wed.

## 2017-01-21 ENCOUNTER — Encounter (HOSPITAL_COMMUNITY): Payer: Self-pay | Admitting: *Deleted

## 2017-01-21 ENCOUNTER — Emergency Department (HOSPITAL_COMMUNITY)
Admission: EM | Admit: 2017-01-21 | Discharge: 2017-01-21 | Disposition: A | Discharge status: Home / Self Care | Payer: Self-pay | Attending: Emergency Medicine | Admitting: Emergency Medicine

## 2017-01-21 DIAGNOSIS — Z79899 Other long term (current) drug therapy: Secondary | ICD-10-CM | POA: Insufficient documentation

## 2017-01-21 DIAGNOSIS — F17210 Nicotine dependence, cigarettes, uncomplicated: Secondary | ICD-10-CM | POA: Insufficient documentation

## 2017-01-21 DIAGNOSIS — K0889 Other specified disorders of teeth and supporting structures: Secondary | ICD-10-CM | POA: Insufficient documentation

## 2017-01-21 DIAGNOSIS — Z5321 Procedure and treatment not carried out due to patient leaving prior to being seen by health care provider: Secondary | ICD-10-CM | POA: Insufficient documentation

## 2017-01-21 NOTE — ED Notes (Signed)
Pt came to desk and states she is leaving. This RN attempted to convince patient she should stay and see a doctor. Pt refuses to stay states she feels better. Unable to persuade to stay.

## 2017-01-21 NOTE — ED Triage Notes (Signed)
Pt also reports left sided chest pain.

## 2017-01-21 NOTE — ED Triage Notes (Signed)
Pt states that she lost a filling in her left tooth "awhile back" pt states that she has pain starting in her tooth and radiating down into her left "side". Pt states that he has also had diarrhea.
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
